# Patient Record
Sex: Female | Born: 1970 | Race: White | Hispanic: Yes | Marital: Married | Smoking: Never smoker
Health system: Southern US, Community
[De-identification: ages and names within clinical notes are randomized; demographics above are authoritative.]

## PROBLEM LIST (undated history)

## (undated) ENCOUNTER — Ambulatory Visit (HOSPITAL_COMMUNITY): Payer: Self-pay | Source: Home / Self Care

---

## 2001-10-27 ENCOUNTER — Encounter: Admission: RE | Admit: 2001-10-27 | Discharge: 2001-10-27 | Payer: Self-pay | Admitting: Family Medicine

## 2001-11-19 ENCOUNTER — Encounter: Admission: RE | Admit: 2001-11-19 | Discharge: 2001-11-19 | Payer: Self-pay | Admitting: Family Medicine

## 2001-11-27 ENCOUNTER — Encounter: Admission: RE | Admit: 2001-11-27 | Discharge: 2001-11-27 | Payer: Self-pay | Admitting: Family Medicine

## 2001-12-17 ENCOUNTER — Encounter: Admission: RE | Admit: 2001-12-17 | Discharge: 2001-12-17 | Payer: Self-pay | Admitting: Family Medicine

## 2001-12-21 ENCOUNTER — Encounter: Admission: RE | Admit: 2001-12-21 | Discharge: 2001-12-21 | Payer: Self-pay | Admitting: Family Medicine

## 2002-01-19 ENCOUNTER — Encounter: Admission: RE | Admit: 2002-01-19 | Discharge: 2002-01-19 | Payer: Self-pay | Admitting: Family Medicine

## 2002-01-27 ENCOUNTER — Ambulatory Visit (HOSPITAL_COMMUNITY): Admission: RE | Admit: 2002-01-27 | Discharge: 2002-01-27 | Payer: Self-pay | Admitting: Family Medicine

## 2002-02-02 ENCOUNTER — Encounter: Admission: RE | Admit: 2002-02-02 | Discharge: 2002-02-02 | Payer: Self-pay | Admitting: Sports Medicine

## 2002-02-18 ENCOUNTER — Encounter: Admission: RE | Admit: 2002-02-18 | Discharge: 2002-02-18 | Payer: Self-pay | Admitting: Family Medicine

## 2002-03-04 ENCOUNTER — Encounter: Admission: RE | Admit: 2002-03-04 | Discharge: 2002-03-04 | Payer: Self-pay | Admitting: Family Medicine

## 2002-03-12 ENCOUNTER — Encounter: Admission: RE | Admit: 2002-03-12 | Discharge: 2002-03-12 | Payer: Self-pay | Admitting: Family Medicine

## 2002-03-16 ENCOUNTER — Encounter: Admission: RE | Admit: 2002-03-16 | Discharge: 2002-03-16 | Payer: Self-pay | Admitting: Family Medicine

## 2002-03-24 ENCOUNTER — Encounter: Admission: RE | Admit: 2002-03-24 | Discharge: 2002-03-24 | Payer: Self-pay | Admitting: Family Medicine

## 2002-03-29 ENCOUNTER — Encounter: Admission: RE | Admit: 2002-03-29 | Discharge: 2002-03-29 | Payer: Self-pay | Admitting: Family Medicine

## 2002-04-07 ENCOUNTER — Encounter: Admission: RE | Admit: 2002-04-07 | Discharge: 2002-04-07 | Payer: Self-pay | Admitting: Sports Medicine

## 2002-04-08 ENCOUNTER — Encounter (HOSPITAL_COMMUNITY): Admission: AD | Admit: 2002-04-08 | Discharge: 2002-04-08 | Payer: Self-pay | Admitting: *Deleted

## 2002-04-10 ENCOUNTER — Inpatient Hospital Stay (HOSPITAL_COMMUNITY): Admission: AD | Admit: 2002-04-10 | Discharge: 2002-04-14 | Payer: Self-pay | Admitting: *Deleted

## 2002-04-10 ENCOUNTER — Encounter (INDEPENDENT_AMBULATORY_CARE_PROVIDER_SITE_OTHER): Payer: Self-pay | Admitting: *Deleted

## 2002-05-24 ENCOUNTER — Encounter: Admission: RE | Admit: 2002-05-24 | Discharge: 2002-05-24 | Payer: Self-pay | Admitting: Family Medicine

## 2002-08-09 ENCOUNTER — Encounter: Admission: RE | Admit: 2002-08-09 | Discharge: 2002-08-09 | Payer: Self-pay | Admitting: Family Medicine

## 2002-08-17 ENCOUNTER — Encounter: Admission: RE | Admit: 2002-08-17 | Discharge: 2002-08-17 | Payer: Self-pay | Admitting: Sports Medicine

## 2002-10-20 ENCOUNTER — Encounter: Admission: RE | Admit: 2002-10-20 | Discharge: 2002-10-20 | Payer: Self-pay | Admitting: Family Medicine

## 2002-10-27 ENCOUNTER — Encounter: Admission: RE | Admit: 2002-10-27 | Discharge: 2002-10-27 | Payer: Self-pay | Admitting: Family Medicine

## 2002-11-23 ENCOUNTER — Encounter: Admission: RE | Admit: 2002-11-23 | Discharge: 2002-11-23 | Payer: Self-pay | Admitting: Family Medicine

## 2003-01-12 ENCOUNTER — Encounter: Admission: RE | Admit: 2003-01-12 | Discharge: 2003-01-12 | Payer: Self-pay | Admitting: Family Medicine

## 2003-01-19 ENCOUNTER — Encounter: Admission: RE | Admit: 2003-01-19 | Discharge: 2003-01-19 | Payer: Self-pay | Admitting: Family Medicine

## 2003-12-30 ENCOUNTER — Encounter (INDEPENDENT_AMBULATORY_CARE_PROVIDER_SITE_OTHER): Payer: Self-pay | Admitting: *Deleted

## 2003-12-30 LAB — CONVERTED CEMR LAB

## 2004-01-18 ENCOUNTER — Encounter: Admission: RE | Admit: 2004-01-18 | Discharge: 2004-01-18 | Payer: Self-pay | Admitting: Family Medicine

## 2004-03-12 ENCOUNTER — Encounter: Admission: RE | Admit: 2004-03-12 | Discharge: 2004-03-12 | Payer: Self-pay | Admitting: Family Medicine

## 2005-05-07 ENCOUNTER — Ambulatory Visit: Payer: Self-pay | Admitting: Family Medicine

## 2005-08-20 ENCOUNTER — Ambulatory Visit (HOSPITAL_COMMUNITY): Admission: RE | Admit: 2005-08-20 | Discharge: 2005-08-20 | Payer: Self-pay | Admitting: Obstetrics

## 2005-12-05 ENCOUNTER — Inpatient Hospital Stay (HOSPITAL_COMMUNITY): Admission: AD | Admit: 2005-12-05 | Discharge: 2005-12-08 | Payer: Self-pay | Admitting: Obstetrics

## 2006-11-28 ENCOUNTER — Encounter (INDEPENDENT_AMBULATORY_CARE_PROVIDER_SITE_OTHER): Payer: Self-pay | Admitting: *Deleted

## 2010-10-17 ENCOUNTER — Ambulatory Visit
Admission: RE | Admit: 2010-10-17 | Discharge: 2010-10-17 | Payer: Self-pay | Source: Home / Self Care | Attending: Obstetrics & Gynecology | Admitting: Obstetrics & Gynecology

## 2011-02-15 NOTE — Op Note (Signed)
[facility]  Patient:    Beth Bernard, Beth Bernard Visit Number: [id number] MRN: [id number]          Service Type: ANT Location: [facility] Attending Physician:  Enid Cutter Dictated by:   Elinor Dodge, M.D. Admit Date:  04/08/2002 Discharge Date: 04/08/2002                             Operative Report  PREOPERATIVE DIAGNOSIS:       Persistent posterior presentation.  POSTOPERATIVE DIAGNOSIS:      Persistent posterior presentation.  PROCEDURE:                    Assisted vacuum partial extraction followed by forceps delivery.  SURGEON:                      Elinor Dodge, M.D.  FINDINGS:                     After pushing 2-1/2 hours, this very fatigued patient who had long labor was found to have a persistent posterior presentation at ROP.  The vacuum was placed on the occiput as far back as possible in order to try and flex the head for possible rotation.  This was not possible but, with three pushes, the patient brought the head down to the introitus from a +2 station.  At this point, the vacuum was removed and the Luikart-McClean forceps were placed on the vertex presenting.  A midline episiotomy was made and the baby delivered from an ROP presentation. Restitution went to LOP.  The baby was sucked.  A nuchal cord around the neck once.  The rest of the baby was easily delivered.  The cord was doubly clamped.  The baby was handed to the pediatricians.  The area was observed for lacerations.  There was a third-degree extension of the episiotomy, and the apex extended up slightly to the right sulcus area.  The uterus was then explored and the placenta removed manually.  The uterus was re-explored and found to be clean.  There were no lacerations of the cervix.  A 2-0 chromic suture was used, starting at the apex of the episiotomy inside the vagina and run with a running lock suture down to the introitus, approximating the hymenal ring and the mucocutaneous junction  in place.  Second sutures were used to reinforce the muscles on the perineum with interrupted, followed by continuous running lock sutures.  Interrupted sutures were used to reapproximate the anal sphincter.  A second layer of tissue was placed over the aforementioned with running lock suture.  The skin edges were approximated with the same suture from skin edge to skin edge up to the fourchette of the vagina.  These were all cut short.  There was minimal bleeding during the procedure.  The vagina was explored and sponges that were in there were removed.  The patient had a total blood loss of 500 cc during the procedure, tolerated it well with an epidural anesthesia. Dictated by:   Elinor Dodge, M.D. Attending Physician:  Enid Cutter DD:  04/12/02 TD:  04/13/02 Job: [id number]

## 2013-07-20 ENCOUNTER — Other Ambulatory Visit: Payer: Self-pay | Admitting: Internal Medicine

## 2013-07-20 DIAGNOSIS — R519 Headache, unspecified: Secondary | ICD-10-CM

## 2013-07-21 ENCOUNTER — Ambulatory Visit
Admission: RE | Admit: 2013-07-21 | Discharge: 2013-07-21 | Disposition: A | Discharge status: Home / Self Care | Payer: No Typology Code available for payment source | Source: Ambulatory Visit | Attending: Internal Medicine | Admitting: Internal Medicine

## 2013-07-21 MED ORDER — IOHEXOL 300 MG/ML  SOLN
75.0000 mL | Freq: Once | INTRAMUSCULAR | Status: AC | PRN
  Administered 2013-07-21: 75 mL via INTRAVENOUS

## 2014-07-23 IMAGING — CT CT HEAD WO/W CM
1 of 2 series · 13 of 30 positions shown, 17 images · IV contrast (omnipaque)
Comparison: None.

CLINICAL DATA: 42-year-old female with headache, pressure,
numbness, vertigo. Initial encounter.

EXAM:
CT HEAD WITHOUT AND WITH CONTRAST
TECHNIQUE: Contiguous axial images were obtained from the base of the skull
through the vertex without and with intravenous contrast
CONTRAST:  75mL OMNIPAQUE IOHEXOL 300 MG/ML  SOLN

[Series 32: 3d filtered head w/o · axial · non-contrast · 0.49mm/px · z∈[+22,+143]mm · 13 of 28 slices shown, 17 images]
[im 2/28  brain]
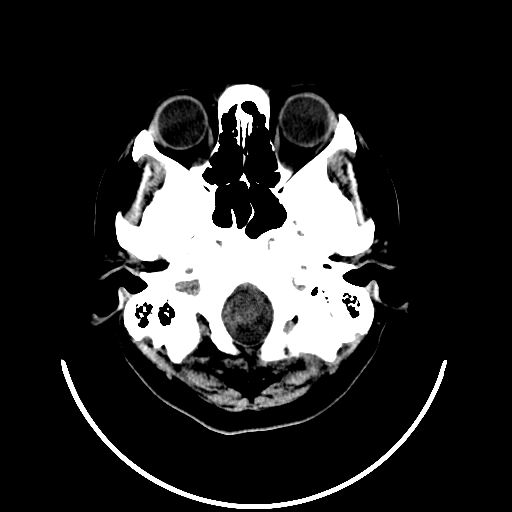
[im 2/28  bone]
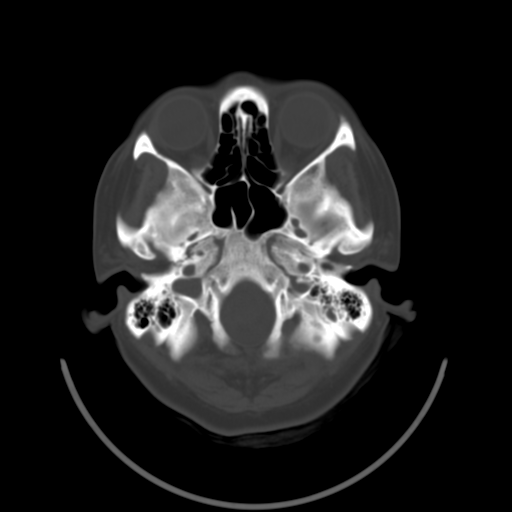
[im 4/28  brain]
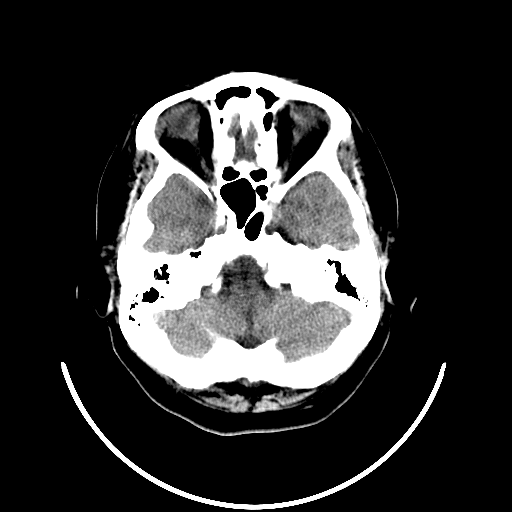
[im 6/28  brain]
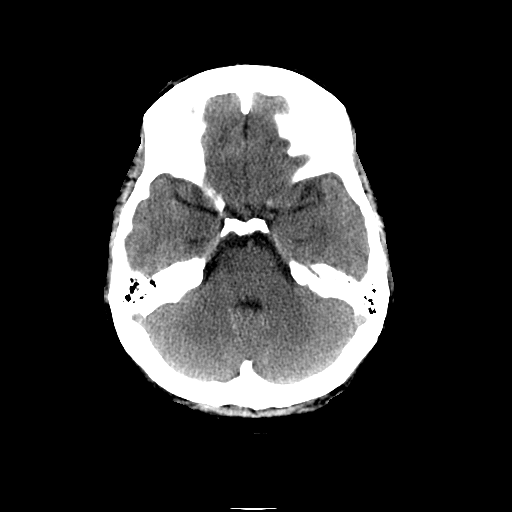
[im 8/28  brain]
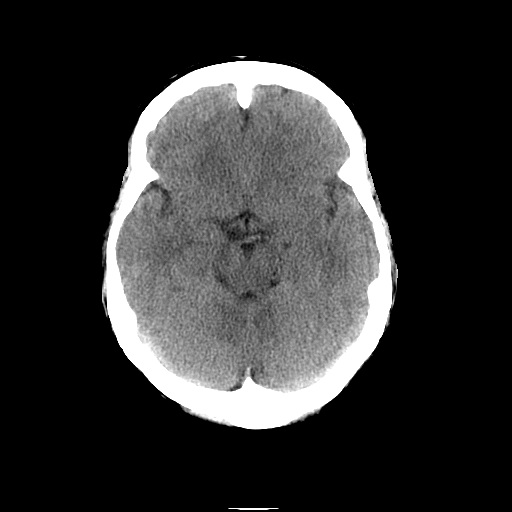
[im 10/28  brain]
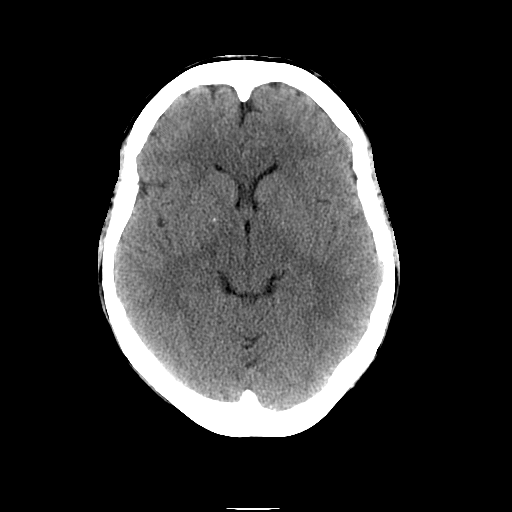
[im 10/28  bone]
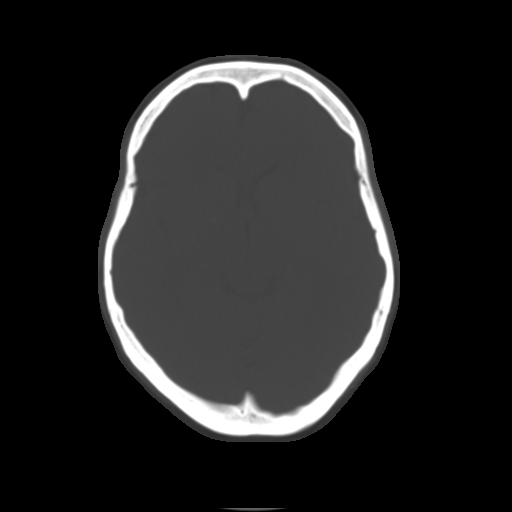
[im 12/28  brain]
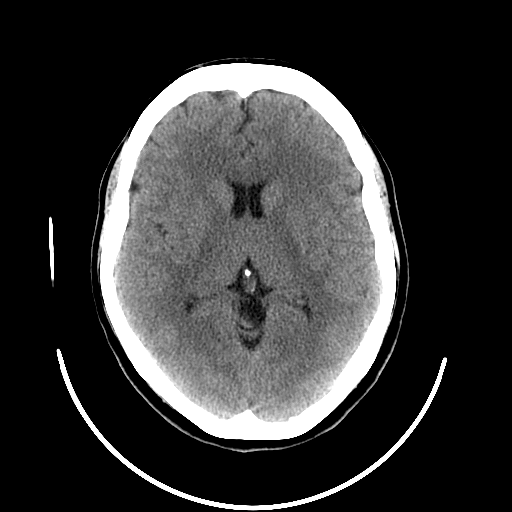
[im 14/28  brain]
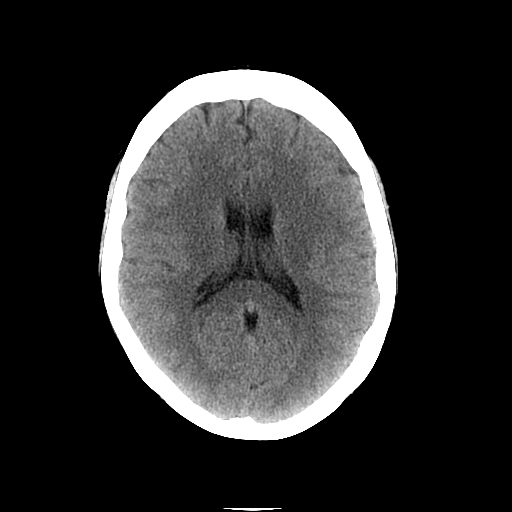
[im 16/28  brain]
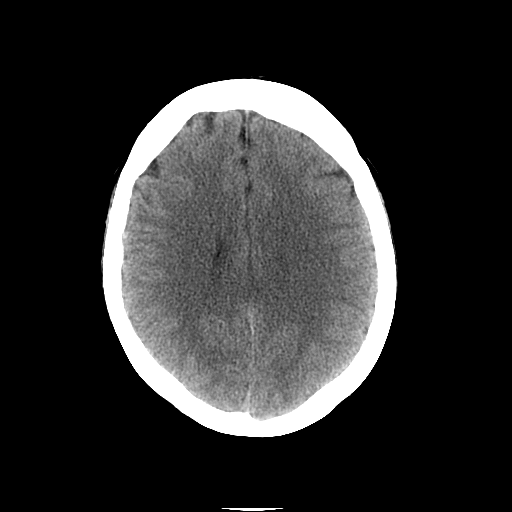
[im 18/28  brain]
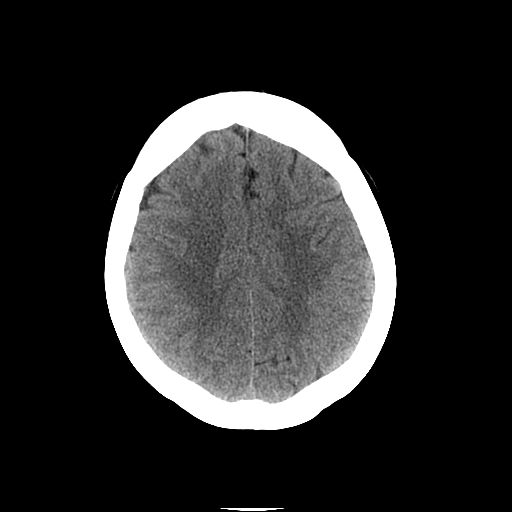
[im 18/28  bone]
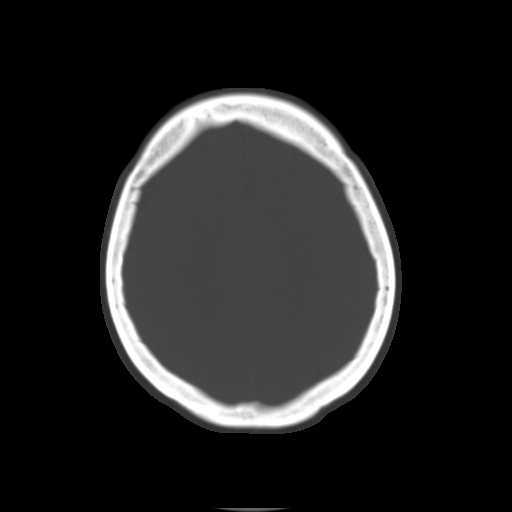
[im 20/28  brain]
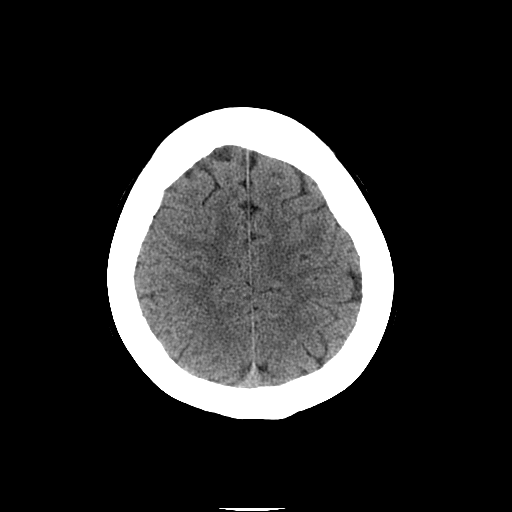
[im 22/28  brain]
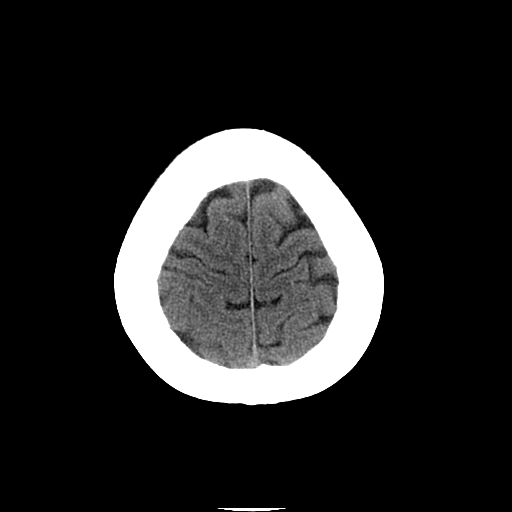
[im 24/28  brain]
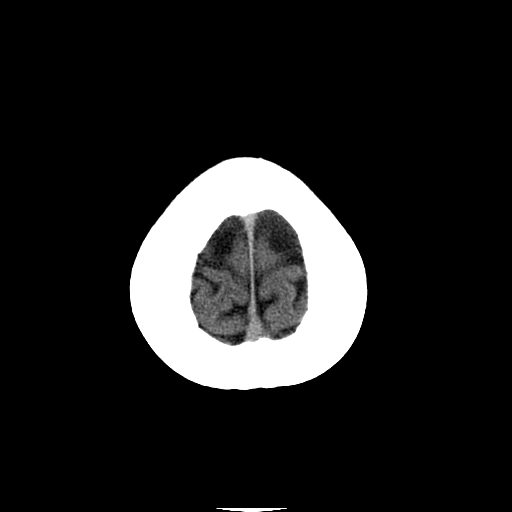
[im 26/28  brain]
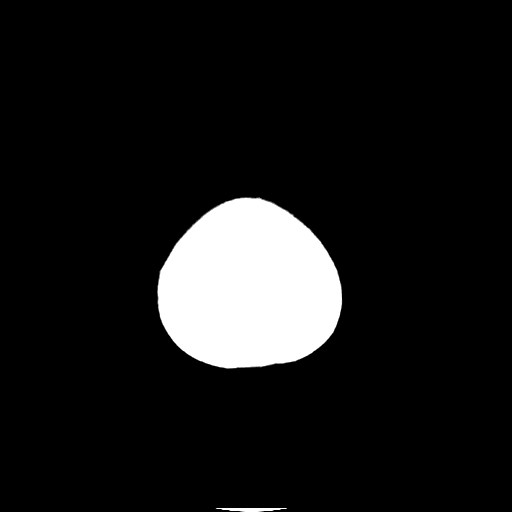
[im 26/28  bone]
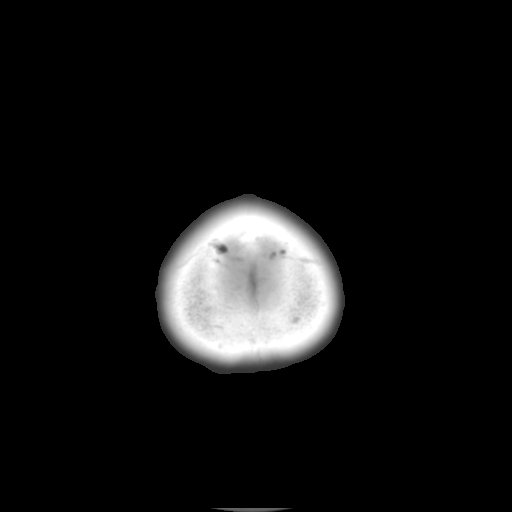

[13 of 30 positions shown; findings below may reference images not displayed]

FINDINGS: Visualized paranasal sinuses and mastoids are clear. Bone
mineralization is within normal limits. No osseous abnormality
identified. Visualized orbits and scalp soft tissues are within
normal limits.

Cerebral volume is normal. No midline shift, ventriculomegaly, mass
effect, evidence of mass lesion, intracranial hemorrhage or evidence
of cortically based acute infarction. Gray-white matter
differentiation is within normal limits throughout the brain. No
abnormal enhancement identified.
IMPRESSION: Normal CT appearance of the brain.

## 2014-10-17 ENCOUNTER — Ambulatory Visit: Payer: Self-pay | Attending: Internal Medicine

## 2014-10-25 ENCOUNTER — Ambulatory Visit: Payer: Self-pay | Admitting: Family Medicine

## 2014-10-25 ENCOUNTER — Encounter: Payer: Self-pay | Admitting: Internal Medicine

## 2014-10-25 ENCOUNTER — Ambulatory Visit: Payer: Self-pay | Attending: Internal Medicine | Admitting: Internal Medicine

## 2014-10-25 VITALS — BP 143/91 | HR 89 | Temp 98.0°F | Resp 16 | Wt 140.0 lb

## 2014-10-25 DIAGNOSIS — Z0000 Encounter for general adult medical examination without abnormal findings: Secondary | ICD-10-CM

## 2014-10-25 DIAGNOSIS — Z139 Encounter for screening, unspecified: Secondary | ICD-10-CM

## 2014-10-25 DIAGNOSIS — Z1231 Encounter for screening mammogram for malignant neoplasm of breast: Secondary | ICD-10-CM

## 2014-10-25 DIAGNOSIS — R519 Headache, unspecified: Secondary | ICD-10-CM | POA: Insufficient documentation

## 2014-10-25 DIAGNOSIS — Z23 Encounter for immunization: Secondary | ICD-10-CM | POA: Insufficient documentation

## 2014-10-25 DIAGNOSIS — R030 Elevated blood-pressure reading, without diagnosis of hypertension: Secondary | ICD-10-CM | POA: Insufficient documentation

## 2014-10-25 DIAGNOSIS — IMO0001 Reserved for inherently not codable concepts without codable children: Secondary | ICD-10-CM

## 2014-10-25 DIAGNOSIS — R51 Headache: Secondary | ICD-10-CM | POA: Insufficient documentation

## 2014-10-25 LAB — CBC WITH DIFFERENTIAL/PLATELET
BASOS ABS: 0 10*3/uL (ref 0.0–0.1)
Basophils Relative: 0 % (ref 0–1)
EOS ABS: 0 10*3/uL (ref 0.0–0.7)
EOS PCT: 1 % (ref 0–5)
HEMATOCRIT: 39.6 % (ref 36.0–46.0)
HEMOGLOBIN: 13.3 g/dL (ref 12.0–15.0)
LYMPHS ABS: 1.8 10*3/uL (ref 0.7–4.0)
Lymphocytes Relative: 41 % (ref 12–46)
MCH: 29.8 pg (ref 26.0–34.0)
MCHC: 33.6 g/dL (ref 30.0–36.0)
MCV: 88.8 fL (ref 78.0–100.0)
MONO ABS: 0.3 10*3/uL (ref 0.1–1.0)
MPV: 9.4 fL (ref 8.6–12.4)
Monocytes Relative: 8 % (ref 3–12)
NEUTROS PCT: 50 % (ref 43–77)
Neutro Abs: 2.2 10*3/uL (ref 1.7–7.7)
PLATELETS: 229 10*3/uL (ref 150–400)
RBC: 4.46 MIL/uL (ref 3.87–5.11)
RDW: 14.4 % (ref 11.5–15.5)
WBC: 4.3 10*3/uL (ref 4.0–10.5)

## 2014-10-25 MED ORDER — AMITRIPTYLINE HCL 75 MG PO TABS: 75.0000 mg | ORAL_TABLET | Freq: Every day | ORAL | Status: DC

## 2014-10-25 NOTE — Progress Notes (Signed)
Patient Demographics  Beth Bernard, is a 44 y.o. female  NWG:[id number]  VHQ:[id number]  DOB - 10-Feb-1971  CC:  Chief Complaint  Patient presents with  . new patient       HPI: Beth Bernard is a 44 y.o. female here today to establish medical care.patient reported to have chronic headache which starts from front to the temple and goes to the back and neck area, she denies any numbness weakness, denies any nausea vomiting, she had a CAT scan done in October 2014 for the similar symptoms and the CAT scan reported to be normal, she has tried over-the-counter Tylenol without much improvement as per patient the headache is more worse at night. Patient has No headache, No chest pain, No abdominal pain - No Nausea, No new weakness tingling or numbness, No Cough - SOB.  No Known Allergies History reviewed. No pertinent past medical history. No current outpatient prescriptions on file prior to visit.   No current facility-administered medications on file prior to visit.   Family History  Problem Relation Age of Onset  . Cancer Paternal Uncle     stomach cancer    History   Social History  . Marital Status: Married    Spouse Name: N/A    Number of Children: N/A  . Years of Education: N/A   Occupational History  . Not on file.   Social History Main Topics  . Smoking status: Never Smoker   . Smokeless tobacco: Not on file  . Alcohol Use: No  . Drug Use: Not on file  . Sexual Activity: Not on file   Other Topics Concern  . Not on file   Social History Narrative  . No narrative on file    Review of Systems: Constitutional: Negative for fever, chills, diaphoresis, activity change, appetite change and fatigue. HENT: Negative for ear pain, nosebleeds, congestion, facial swelling, rhinorrhea, neck pain, neck stiffness and ear discharge.  Eyes: Negative for pain, discharge, redness, itching and visual disturbance. Respiratory: Negative for cough, choking,  chest tightness, shortness of breath, wheezing and stridor.  Cardiovascular: Negative for chest pain, palpitations and leg swelling. Gastrointestinal: Negative for abdominal distention. Genitourinary: Negative for dysuria, urgency, frequency, hematuria, flank pain, decreased urine volume, difficulty urinating and dyspareunia.  Musculoskeletal: Negative for back pain, joint swelling, arthralgia and gait problem. Neurological: Negative for dizziness, tremors, seizures, syncope, facial asymmetry, speech difficulty, weakness, light-headedness, numbness and headaches.  Hematological: Negative for adenopathy. Does not bruise/bleed easily. Psychiatric/Behavioral: Negative for hallucinations, behavioral problems, confusion, dysphoric mood, decreased concentration and agitation.    Objective:   Filed Vitals:   10/25/14 1610  BP: 143/91  Pulse: 89  Temp: 98 F (36.7 C)  Resp: 16    Physical Exam: Constitutional: Patient appears well-developed and well-nourished. No distress. HENT: Normocephalic, atraumatic, External right and left ear normal. Oropharynx is clear and moist.  Eyes: Conjunctivae and EOM are normal. PERRLA, no scleral icterus. Neck: Normal ROM. Neck supple. No JVD. No tracheal deviation. No thyromegaly. CVS: RRR, S1/S2 +, no murmurs, no gallops, no carotid bruit.  Pulmonary: Effort and breath sounds normal, no stridor, rhonchi, wheezes, rales.  Abdominal: Soft. BS +, no distension, tenderness, rebound or guarding.  Musculoskeletal: Normal range of motion. No edema and no tenderness.  Neuro: Alert. Normal reflexes, muscle tone coordination. No cranial nerve deficit. Skin: Skin is warm and dry. No rash noted. Not diaphoretic. No erythema. No pallor. Psychiatric: Normal mood and affect. Behavior, judgment, thought content normal.  No  results found for: WBC, HGB, HCT, MCV, PLT No results found for: CREATININE, BUN, NA, K, CL, CO2  No results found for: HGBA1C Lipid Panel  No  results found for: CHOL, TRIG, HDL, CHOLHDL, VLDL, LDLCALC     Assessment and plan:   1. Chronic nonintractable headache, unspecified headache type Patient had CT scan done 10/14 for similar symptoms which was negative Her neuro exam is non focal likely tension headache trial of elavil. - amitriptyline (ELAVIL) 75 MG tablet; Take 1 tablet (75 mg total) by mouth at bedtime.  Dispense: 30 tablet; Refill: 3  4. Elevated BP Advised patient for DASH diet   5. Encounter for screening mammogram for breast cancer  - MM DIGITAL SCREENING BILATERAL; Future  6. Screening Ordered  baseline blood work  - CBC with Differential/Platelet - COMPLETE METABOLIC PANEL WITH GFR - TSH - Lipid panel - Vit D  25 hydroxy (rtn osteoporosis monitoring) - Hemoglobin A1c  Health Maintenance  -Pap Smear: will be scheduled in our office  -Mammogram: ordered  -Vaccinations:  Flu shot and Tdap  given today   Return in about 3 months (around 01/24/2015) for Headache,  Schedule appt with Dr Glendora ScoreFunches/ Valerie for PAP .   Doris CheadleADVANI, Kanya Potteiger, MD

## 2014-10-25 NOTE — Patient Instructions (Signed)
Plan de alimentacin DASH (DASH Eating Plan) DASH es la sigla en ingls de "Enfoques Alimentarios para Detener la Hipertensin". El plan de alimentacin DASH ha demostrado bajar la presin arterial elevada (hipertensin). Los beneficios adicionales para la salud pueden incluir la disminucin del riesgo de diabetes mellitus tipo2, enfermedades cardacas e ictus. Este plan tambin puede ayudar a adelgazar. QU DEBO SABER ACERCA DEL PLAN DE ALIMENTACIN DASH? Para el plan de alimentacin DASH, seguir las siguientes pautas generales:  Elija los alimentos con un valor porcentual diario de sodio de menos del 5% (segn figura en la etiqueta del alimento).  Use hierbas o aderezos sin sal, en lugar de sal de mesa o sal marina.  Consulte al mdico o farmacutico antes de usar sustitutos de la sal.  Coma productos con bajo contenido de sodio, cuya etiqueta suele decir "bajo contenido de sodio" o "sin agregado de sal".  Coma alimentos frescos.  Coma ms verduras, frutas y productos lcteos con bajo contenido de grasas.  Elija los cereales integrales. Busque la palabra "integral" en el primer lugar de la lista de ingredientes.  Elija el pescado y el pollo o el pavo sin piel ms a menudo que las carnes rojas. Limite el consumo de pescado, carne de ave y carne a 6onzas (170g) por da.  Limite el consumo de dulces, postres, azcares y bebidas azucaradas.  Elija las grasas saludables para el corazn.  Limite el consumo de queso a 1onza (28g) por da.  Consuma ms comida casera y menos de restaurante, de buf y comida rpida.  Limite el consumo de alimentos fritos.  Cocine los alimentos utilizando mtodos que no sean la fritura.  Limite las verduras enlatadas. Si las consume, enjuguelas bien para disminuir el sodio.  Cuando coma en un restaurante, pida que preparen su comida con menos sal o, en lo posible, sin nada de sal. QU ALIMENTOS PUEDO COMER? Pida ayuda a un nutricionista para  conocer las necesidades calricas individuales. Cereales Pan de salvado o integral. Arroz integral. Pastas de salvado o integrales. Quinua, trigo burgol y cereales integrales. Cereales con bajo contenido de sodio. Tortillas de harina de maz o de salvado. Pan de maz integral. Galletas saladas integrales. Galletas con bajo contenido de sodio. Vegetales Verduras frescas o congeladas (crudas, al vapor, asadas o grilladas). Jugos de tomate y verduras con contenido bajo o reducido de sodio. Pasta y salsa de tomate con contenido bajo o reducido de sodio. Verduras enlatadas con bajo contenido de sodio o reducido de sodio.  Frutas Frutas frescas, en conserva (en su jugo natural) o frutas congeladas. Carnes y otros productos con protenas Carne de res molida (al 85% o ms magra), carne de res de animales alimentados con pastos o carne de res sin la grasa. Pollo o pavo sin piel. Carne de pollo o de pavo molida. Cerdo sin la grasa. Todos los pescados y frutos de mar. Huevos. Porotos, guisantes o lentejas secos. Frutos secos y semillas sin sal. Frijoles enlatados sin sal. Lcteos Productos lcteos con bajo contenido de grasas, como leche descremada o al 1%, quesos reducidos en grasas o al 2%, ricota con bajo contenido de grasas o queso cottage, o yogur natural con bajo contenido de grasas. Quesos con contenido bajo o reducido de sodio. Grasas y aceites Margarinas en barra que no contengan grasas trans. Mayonesa y alios para ensaladas livianos o reducidos en grasas (reducidos en sodio). [place]ac[place]. Aceites de crtamo, oliva o canola. Mantequilla natural de man o almendra. Otros Palomitas de maz y pretzels sin sal.   Los artculos mencionados arriba pueden no ser una lista completa de las bebidas o los alimentos recomendados. Comunquese con el nutricionista para conocer ms opciones. QU ALIMENTOS NO SE RECOMIENDAN? Cereales Pan blanco. Pastas blancas. Arroz blanco. Pan de maz refinado. Bagels y  croissants. Galletas saladas que contengan grasas trans. Vegetales Vegetales con crema o fritos. Verduras en salsa de queso. Verduras enlatadas comunes. Pasta y salsa de tomate en lata comunes. Jugos comunes de tomate y de verduras. Frutas Frutas secas. Fruta enlatada en almbar liviano o espeso. Jugo de frutas. Carnes y otros productos con protenas Cortes de carne con grasa. Costillas, alas de pollo, tocineta, salchicha, mortadela, salame, chinchulines, tocino, perros calientes, salchichas alemanas y embutidos envasados. Frutos secos y semillas con sal. Frijoles con sal en lata. Lcteos Leche entera o al 2%, crema, mezcla de leche y crema, y queso crema. Yogur entero o endulzado. Quesos o queso azul con alto contenido de grasas. Cremas no lcteas y coberturas batidas. Quesos procesados, quesos para untar o cuajadas. Condimentos Sal de cebolla y ajo, sal condimentada, sal de mesa y sal marina. Salsas en lata y envasadas. Salsa Worcestershire. Salsa trtara. Salsa barbacoa. Salsa teriyaki. Salsa de soja, incluso la que tiene contenido reducido de sodio. Salsa de carne. Salsa de pescado. Salsa de ostras. Salsa rosada. Rbano picante. Ketchup y mostaza. Saborizantes y tiernizantes para carne. Caldo en cubitos. Salsa picante. Salsa tabasco. Adobos. Aderezos para tacos. Salsas. Grasas y aceites Mantequilla, margarina en barra, manteca de cerdo, grasa, mantequilla clarificada y grasa de tocino. Aceites de coco, de palmiste o de palma. Aderezos comunes para ensalada. Otros Pickles y aceitunas. Palomitas de maz y pretzels con sal. Los artculos mencionados arriba pueden no ser una lista completa de las bebidas y los alimentos que se deben evitar. Comunquese con el nutricionista para obtener ms informacin. DNDE PUEDO ENCONTRAR MS INFORMACIN? [employer] del Corazn, del Pulmn y de la Sangre (National Heart, Lung, and Blood Institute):  www.nhlbi.nih.gov/health/health-topics/topics/dash/ Document Released: 09/05/2011 Document Revised: 01/31/2014 ExitCare Patient Information 2015 [employer]. This information is not intended to replace advice given to you by your health care provider. Make sure you discuss any questions you have with your health care provider.   

## 2014-10-25 NOTE — Progress Notes (Signed)
Patient here with interpreter Patient here to establish care Complains of having headaches on and off for the past year Pain sometimes shoots to the back of her head and down her back Patient also complains of having some pain to the back of her eye

## 2014-10-26 LAB — COMPLETE METABOLIC PANEL WITH GFR
ALBUMIN: 4.2 g/dL (ref 3.5–5.2)
ALT: 13 U/L (ref 0–35)
AST: 19 U/L (ref 0–37)
Alkaline Phosphatase: 97 U/L (ref 39–117)
BUN: 15 mg/dL (ref 6–23)
CALCIUM: 9.3 mg/dL (ref 8.4–10.5)
CHLORIDE: 106 meq/L (ref 96–112)
CO2: 29 meq/L (ref 19–32)
Creat: 0.68 mg/dL (ref 0.50–1.10)
GFR, Est African American: >89 mL/min
GFR, Est Non African American: >89 mL/min
GLUCOSE: 104 mg/dL — AB (ref 70–99)
POTASSIUM: 5 meq/L (ref 3.5–5.3)
Sodium: 141 mEq/L (ref 135–145)
Total Bilirubin: 0.7 mg/dL (ref 0.2–1.2)
Total Protein: 6.9 g/dL (ref 6.0–8.3)

## 2014-10-26 LAB — LIPID PANEL
CHOL/HDL RATIO: 3.4 ratio
CHOLESTEROL: 161 mg/dL (ref 0–200)
HDL: 48 mg/dL (ref >39)
LDL CALC: 64 mg/dL (ref 0–99)
Triglycerides: 246 mg/dL — ABNORMAL HIGH (ref <150)
VLDL: 49 mg/dL — ABNORMAL HIGH (ref 0–40)

## 2014-10-26 LAB — HEMOGLOBIN A1C
Hgb A1c MFr Bld: 5.2 % (ref <5.7)
Mean Plasma Glucose: 103 mg/dL (ref <117)

## 2014-10-26 LAB — TSH: TSH: 2.278 u[IU]/mL (ref 0.350–4.500)

## 2014-10-26 LAB — VITAMIN D 25 HYDROXY (VIT D DEFICIENCY, FRACTURES): VIT D 25 HYDROXY: 8 ng/mL — AB (ref 30–100)

## 2014-10-27 ENCOUNTER — Telehealth: Payer: Self-pay | Admitting: *Deleted

## 2014-10-27 MED ORDER — VITAMIN D (ERGOCALCIFEROL) 1.25 MG (50000 UNIT) PO CAPS: 50000.0000 [IU] | ORAL_CAPSULE | ORAL | Status: DC

## 2014-10-27 NOTE — Telephone Encounter (Signed)
Left voice message to return call (VM was left in Spanish) [facility]

## 2014-10-27 NOTE — Telephone Encounter (Signed)
-----   Message from Doris Cheadleeepak Advani, MD sent at 10/26/2014  9:16 AM EST ----- Blood work reviewed, noticed low vitamin D, call patient advise to start ergocalciferol 50,000 units once a week for the duration of  12 weeks.  noticed elevated triglycerides, advise patient for low fat diet.

## 2014-11-04 ENCOUNTER — Encounter: Payer: Self-pay | Admitting: Family Medicine

## 2014-11-04 ENCOUNTER — Ambulatory Visit: Payer: Self-pay | Attending: Family Medicine | Admitting: Family Medicine

## 2014-11-04 VITALS — BP 119/77 | HR 77 | Temp 98.1°F | Resp 16 | Ht 64.0 in | Wt 139.0 lb

## 2014-11-04 DIAGNOSIS — N898 Other specified noninflammatory disorders of vagina: Secondary | ICD-10-CM

## 2014-11-04 DIAGNOSIS — N814 Uterovaginal prolapse, unspecified: Secondary | ICD-10-CM

## 2014-11-04 DIAGNOSIS — Z124 Encounter for screening for malignant neoplasm of cervix: Secondary | ICD-10-CM

## 2014-11-04 NOTE — Assessment & Plan Note (Signed)
Pap, wet prep GC/chlam done today

## 2014-11-04 NOTE — Progress Notes (Signed)
[employer] interpreter used Patient here for pap smear smear Patient has had a flu shot Patient reports " 3 years ago her uterus detached so now it is outside the body"

## 2014-11-04 NOTE — Progress Notes (Signed)
   Subjective:    Patient ID: Beth Bernard, female    DOB: 30-Jul-1971, 44 y.o.   MRN: [id number] CC: pap smear  PCP: Dr. Orpah CobbAdvani  HPI 44 yo F presents for pap Spanish interpreter present  1. Pap smear: no hx of abnormal paps. Some vaginal d/c w/o itching or odor. Has hx of prolapsed uterus. No pelvic pain or discomfort.   Soc Hx: non smoker  Review of Systems As per HPI     Objective:   Physical Exam BP 119/77 mmHg  Pulse 77  Temp(Src) 98.1 F (36.7 C)  Resp 16  Ht 5\' 4"  (1.626 m)  Wt 139 lb (63.05 kg)  BMI 23.85 kg/m2  SpO2 100%  LMP 10/29/2014 General appearance: alert, cooperative and no distress Abdomen: soft, non-tender; bowel sounds normal; no masses,  no organomegaly Pelvic: cervix normal in appearance, external genitalia normal, no adnexal masses or tenderness, no cervical motion tenderness, positive findings: uterine prolapse or vaginal discharge:  white and mucoid, rectovaginal septum normal and uterus normal size, shape, and consistency       Assessment & Plan:

## 2014-11-04 NOTE — Assessment & Plan Note (Signed)
A: confirmed on exam P: Reassurance  Gyn referral prn symptoms or patient preference for discussion

## 2014-11-04 NOTE — Patient Instructions (Signed)
Mrs. Beth Bernard,  Thank you for coming in today. You will be called with pap results  Dr. Armen PickupFunches

## 2014-11-07 ENCOUNTER — Telehealth: Payer: Self-pay | Admitting: *Deleted

## 2014-11-07 DIAGNOSIS — N898 Other specified noninflammatory disorders of vagina: Secondary | ICD-10-CM | POA: Insufficient documentation

## 2014-11-07 LAB — CERVICOVAGINAL ANCILLARY ONLY
Chlamydia: NEGATIVE
Neisseria Gonorrhea: NEGATIVE
WET PREP (BD AFFIRM): POSITIVE — AB
Wet Prep (BD Affirm): NEGATIVE
Wet Prep (BD Affirm): NEGATIVE

## 2014-11-07 MED ORDER — FLUCONAZOLE 150 MG PO TABS: 150.0000 mg | ORAL_TABLET | ORAL | Status: DC

## 2014-11-07 NOTE — Addendum Note (Signed)
Addended by: Dessa PhiFUNCHES, Anjanette Gilkey on: 11/07/2014 09:12 AM   Modules accepted: Orders

## 2014-11-07 NOTE — Telephone Encounter (Signed)
-----   Message from Lora PaulaJosalyn C Funches, MD sent at 11/07/2014  5:13 PM EST ----- Neg GC/chlam

## 2014-11-07 NOTE — Assessment & Plan Note (Signed)
Yeast on wet prep, sent in diflucan

## 2014-11-07 NOTE — Telephone Encounter (Signed)
LVM to return call.

## 2014-11-07 NOTE — Telephone Encounter (Signed)
LVM to return call, [facility] (VM left in Spanish)

## 2014-11-07 NOTE — Telephone Encounter (Signed)
-----   Message from Lora PaulaJosalyn C Funches, MD sent at 11/07/2014  9:11 AM EST ----- Negative wet prep except for yeast. Sent in diflucan

## 2014-11-08 LAB — CYTOLOGY - PAP

## 2014-11-10 ENCOUNTER — Telehealth: Payer: Self-pay | Admitting: *Deleted

## 2014-11-10 NOTE — Telephone Encounter (Signed)
-----   Message from Lora PaulaJosalyn C Funches, MD sent at 11/10/2014  9:11 AM EST ----- Negative pap repeat in 5 years

## 2014-11-10 NOTE — Telephone Encounter (Signed)
Left voice message with normal results  If any question return call (message left in Spanish)

## 2014-11-16 ENCOUNTER — Ambulatory Visit (HOSPITAL_COMMUNITY)
Admission: RE | Admit: 2014-11-16 | Discharge: 2014-11-16 | Disposition: A | Discharge status: Home / Self Care | Payer: Self-pay | Source: Ambulatory Visit | Attending: Internal Medicine | Admitting: Internal Medicine

## 2014-11-16 DIAGNOSIS — Z1231 Encounter for screening mammogram for malignant neoplasm of breast: Secondary | ICD-10-CM | POA: Insufficient documentation

## 2014-12-14 ENCOUNTER — Emergency Department (INDEPENDENT_AMBULATORY_CARE_PROVIDER_SITE_OTHER)
Admission: EM | Admit: 2014-12-14 | Discharge: 2014-12-14 | Disposition: A | Discharge status: Home / Self Care | Payer: Self-pay | Source: Home / Self Care | Attending: Family Medicine | Admitting: Family Medicine

## 2014-12-14 ENCOUNTER — Encounter (HOSPITAL_COMMUNITY): Payer: Self-pay | Admitting: Emergency Medicine

## 2014-12-14 DIAGNOSIS — M791 Myalgia, unspecified site: Secondary | ICD-10-CM

## 2014-12-14 MED ORDER — NAPROXEN 375 MG PO TABS: 375.0000 mg | ORAL_TABLET | Freq: Two times a day (BID) | ORAL | Status: DC

## 2014-12-14 NOTE — ED Provider Notes (Signed)
Lynnae PrudeMaria Cabanas-Monroy is a 44 y.o. female who presents to Urgent Care today for body aches headache chills. Symptoms present for one day. Patient has tried Tylenol which helps some. No vomiting diarrhea. Patient does note subjective fevers and chills. No chest pains palpitations abdominal pain.   History reviewed. No pertinent past medical history. History reviewed. No pertinent past surgical history. History  Substance Use Topics  . Smoking status: Never Smoker   . Smokeless tobacco: Not on file  . Alcohol Use: No   ROS as above Medications: No current facility-administered medications for this encounter.   Current Outpatient Prescriptions  Medication Sig Dispense Refill  . amitriptyline (ELAVIL) 75 MG tablet Take 1 tablet (75 mg total) by mouth at bedtime. 30 tablet 3  . fluconazole (DIFLUCAN) 150 MG tablet Take 1 tablet (150 mg total) by mouth every 3 (three) days. 2 tablet 0  . naproxen (NAPROSYN) 375 MG tablet Take 1 tablet (375 mg total) by mouth 2 (two) times daily. 20 tablet 0  . Vitamin D, Ergocalciferol, (DRISDOL) 50000 UNITS CAPS capsule Take 1 capsule (50,000 Units total) by mouth every 7 (seven) days. 12 capsule 0   No Known Allergies   Exam:  BP 115/79 mmHg  Pulse 82  Temp(Src) 98.4 F (36.9 C) (Oral)  Resp 16  SpO2 99%  LMP 11/08/2014 Gen: Well NAD HEENT: EOMI,  MMM clear nasal discharge. Normal tympanic membranes Lungs: Normal work of breathing. CTABL Heart: RRR no MRG Abd: NABS, Soft. Nondistended, Nontender Exts: Brisk capillary refill, warm and well perfused.   No results found for this or any previous visit (from the past 24 hour(s)). No results found.  Assessment and Plan: 44 y.o. female with viral URI. Treat with naproxen. Return as needed.  Discussed warning signs or symptoms. Please see discharge instructions. Patient expresses understanding.     Rodolph BongEvan S Paxton Kanaan, MD 12/14/14 [phone]

## 2014-12-14 NOTE — ED Notes (Signed)
C/o body aches, headaches, nauseas, chills and abd pain Denies urinary sx, fevers, diarrhea Alert, no signs of acute distress.

## 2014-12-14 NOTE — Discharge Instructions (Signed)
Thank you for coming in today.  Gripe (Influenza) La gripe es una infeccin viral del tracto respiratorio. Ocurre con ms frecuencia en los meses de invierno, ya que las personas pasan ms tiempo en contacto cercano. La gripe puede enfermarlo considerablemente. Se transmite fcilmente de [place] persona a otra (es contagiosa). CAUSAS  La causa es un virus que infecta el tracto respiratorio. Puede contagiarse el virus al aspirar las gotitas que una persona infectada elimina al toser o Engineering geologistestornudar. Tambin puede contagiarse al tocar algo que fue recientemente contaminado con el virus y Tenet Healthcareluego llevarse la mano a la boca, la nariz o los ojos. RIESGOS Y COMPLICACIONES Tendr mayor riesgo de sufrir un resfro grave si consume cigarrillos, es diabtico, sufre una enfermedad cardaca (como insuficiencia cardaca) o pulmonar crnica (como asma) o si tiene debilitado el sistema inmunolgico. Los ancianos y las mujeres embarazadas tienen ms riesgo de sufrir infecciones graves. El problema ms frecuente de la gripe es la infeccin pulmonar (neumona). En algunos casos, este problema puede requerir atencin mdica de emergencia y Biochemist, clinicalponer en peligro la vida. Marnee SpringSIGNOS Y SNTOMAS  Los sntomas pueden durar entre 4 y [address] das y pueden ser:  Grant RutsFiebre.  Escalofros.  Dolor de Turkmenistancabeza, dolores en el cuerpo y musculares.  Dolor de Advertising copywritergarganta.  Molestias en el pecho y tos.  Prdida del apetito.  Debilidad o cansancio.  Mareos.  Nuseas o vmitos. DIAGNSTICO  El diagnstico se realiza segn su historia clnica y un examen fsico. Es necesario realizar un anlisis de cultivo farngeo o nasal para confirmar el diagnstico. TRATAMIENTO  En los casos leves, la gripe se cura sin TEFL teachertratamiento. El tratamiento est dirigido a Consulting civil engineeraliviar los sntomas. En los casos ms graves, el mdico podr recetar medicamentos antivirales para acortar el curso de la enfermedad. Los antibiticos no son eficaces, ya que la infeccin est causada por  un virus y no una bacteria. INSTRUCCIONES PARA EL CUIDADO EN EL HOGAR  Tome los medicamentos solamente como se lo haya indicado el mdico.  Utilice un humidificador de niebla fra para facilitar la respiracin.  Haga reposo hasta que la temperatura vuelva a ser normal. Generalmente esto lleva entre 3 y [address] das.  Beba suficiente lquido para Photographermantener la orina clara o de color amarillo plido.  Cbrase la boca y la nariz al toser o Engineering geologistestornudar, y Allstatelvese las manos muy bien para evitar que se propague el virus.  Lanny HurstQudese en su casa y no concurra al Aleen Campitrabajo o a la escuela hasta que la fiebre haya desaparecido al menos por un da completo. PREVENCIN  La vacunacin anual contra la gripe es la mejor manera de evitar enfermarse. Se recomienda ahora de manera rutinaria una vacuna anual contra la gripe a todos los Lowe's Companiesadultos estadounidenses. SOLICITE ATENCIN MDICA SI:  Tiene dolor en el pecho, la tos empeora o tiene ms mucosidad.  Tiene nuseas, vmitos o diarrea.  La fiebre regresa o empeora. SOLICITE ATENCIN MDICA DE INMEDIATO SI:   Tiene dificultad para respirar, le falta el aire o tiene la piel o las uas Hutchinsonazuladas.  Presenta dolor intenso o entumecimiento en el cuello.  Le duele la cabeza de forma repentina o tiene dolor en la cara o el odo.  Tiene nuseas o vmitos que no puede controlar. ASEGRESE DE QUE:   Comprende estas instrucciones.  Controlar su afeccin.  Recibir ayuda de inmediato si no mejora o si empeora. Document Released: 06/26/2005 Document Revised: 01/31/2014 [facility] [employer] Patient Information 2015 [employer], [place]. This information is not intended to replace advice  given to you by your health care provider. Make sure you discuss any questions you have with your health care provider.

## 2015-02-08 ENCOUNTER — Emergency Department (INDEPENDENT_AMBULATORY_CARE_PROVIDER_SITE_OTHER)
Admission: EM | Admit: 2015-02-08 | Discharge: 2015-02-08 | Disposition: A | Discharge status: Home / Self Care | Payer: Self-pay | Source: Home / Self Care | Attending: Family Medicine | Admitting: Family Medicine

## 2015-02-08 ENCOUNTER — Encounter (HOSPITAL_COMMUNITY): Payer: Self-pay | Admitting: Emergency Medicine

## 2015-02-08 DIAGNOSIS — R059 Cough, unspecified: Secondary | ICD-10-CM

## 2015-02-08 DIAGNOSIS — R05 Cough: Secondary | ICD-10-CM

## 2015-02-08 DIAGNOSIS — R0982 Postnasal drip: Secondary | ICD-10-CM

## 2015-02-08 MED ORDER — IPRATROPIUM BROMIDE 0.06 % NA SOLN: 2.0000 | Freq: Four times a day (QID) | NASAL | Status: DC

## 2015-02-08 MED ORDER — BENZONATATE 100 MG PO CAPS
100.0000 mg | ORAL_CAPSULE | Freq: Three times a day (TID) | ORAL | Status: DC | PRN
Start: 2015-02-08 — End: 2023-11-06

## 2015-02-08 MED ORDER — FLUTICASONE PROPIONATE 50 MCG/ACT NA SUSP: 2.0000 | Freq: Every day | NASAL | Status: DC

## 2015-02-08 NOTE — Discharge Instructions (Signed)
Your symptoms are likely from postnasal drip causing lung irritation and cough. Please start using a daily allergy medicine such as Zyrtec or Allegra. Please use the nasal Atrovent to try it nasal secretions and Flonase at night to help with the swollen nasal passages in the back. Please use the Tessalon Perles for cough during the day or night. Noted that this cough may last a couple more weeks before going away. There is no sign of pneumonia or other serious infection at this period in time. He is also start using ibuprofen 600 mg every 6 hours.   Sus sntomas son probablemente de goteo retronasal causar irritacin de los pulmones y la tos . Por favor, empiece a usar una medicina para la Programmer, multimediaalergia al da como Zyrtec o [place]legra . Utilice el Atrovent nasal para probarlo secreciones nasales y Flonase por la noche para ayudar con los conductos nasales inflamados en la parte posterior . Utilice el Tessalon Perles para la tos durante el da o la noche . Seal que esta tos puede durar un par de semanas ms antes de irse . No hay ninguna seal de neumona u otras infecciones graves en este perodo de [place] Citytiempo. Tambin se empiece a usar ibuprofeno 600 mg cada 6 horas

## 2015-02-08 NOTE — ED Notes (Signed)
C/o productive cough onset 1 week Denies runny nose, PND, congestion, fevers, chills Alert, no signs of acute distress.

## 2015-02-08 NOTE — ED Provider Notes (Signed)
CSN: [id number]     Arrival date & time 02/08/15  0808 History   First MD Initiated Contact with Patient 02/08/15 [phone]     Chief Complaint  Patient presents with  . Cough   (Consider location/radiation/quality/duration/timing/severity/associated sxs/prior Treatment) HPI   Cough: 2 weeks. Worse at night. Tried Nyquil, and other OTC medications w/o benefit. No change. Intermittent phlegm production. Symptoms overall are intermittent. Denies recent cold symptoms, fevers, shortness breath, palpitations, chest pain, headache, neck stiffness, abdominal pain, dysuria, frequency, back pain. No notes in the home has a cough or similar symptoms.  History reviewed. No pertinent past medical history. History reviewed. No pertinent past surgical history. Family History  Problem Relation Age of Onset  . Cancer Paternal Uncle     stomach cancer    History  Substance Use Topics  . Smoking status: Never Smoker   . Smokeless tobacco: Not on file  . Alcohol Use: No   OB History    No data available     Review of Systems Per HPI with all other pertinent systems negative.   Allergies  Review of patient's allergies indicates no known allergies.  Home Medications   Prior to Admission medications   Medication Sig Start Date End Date Taking? Authorizing Provider  amitriptyline (ELAVIL) 75 MG tablet Take 1 tablet (75 mg total) by mouth at bedtime. 10/25/14   Doris Cheadleeepak Advani, MD  benzonatate (TESSALON PERLES) 100 MG capsule Take 1-2 capsules (100-200 mg total) by mouth 3 (three) times daily as needed for cough. 02/08/15   Ozella Rocksavid J Calliope Delangel, MD  fluticasone (FLONASE) 50 MCG/ACT nasal spray Place 2 sprays into both nostrils at bedtime. 02/08/15   Ozella Rocksavid J Trinidy Masterson, MD  ipratropium (ATROVENT) 0.06 % nasal spray Place 2 sprays into both nostrils 4 (four) times daily. 02/08/15   Ozella Rocksavid J Jessic Standifer, MD  naproxen (NAPROSYN) 375 MG tablet Take 1 tablet (375 mg total) by mouth 2 (two) times daily. 12/14/14   Rodolph BongEvan S Corey,  MD  Vitamin D, Ergocalciferol, (DRISDOL) 50000 UNITS CAPS capsule Take 1 capsule (50,000 Units total) by mouth every 7 (seven) days. 10/27/14   Doris Cheadleeepak Advani, MD   BP 120/82 mmHg  Pulse 58  Temp(Src) 98 F (36.7 C) (Oral)  Resp 12  SpO2 98%  LMP 02/08/2015 Physical Exam Physical Exam  Constitutional: oriented to person, place, and time. appears well-developed and well-nourished. No distress.  HENT:  Boggy nasal turbinates on the left. Pharyngeal cobblestoning. Tonsils 0-1+ without exudate. Head: Normocephalic and atraumatic.  Eyes: EOMI. PERRL.  Neck: Normal range of motion.  Cardiovascular: RRR, no m/r/g, 2+ distal pulses,  Pulmonary/Chest: Effort normal and breath sounds normal. No respiratory distress.  Abdominal: Soft. Bowel sounds are normal. NonTTP, no distension.  Musculoskeletal: Normal range of motion. Non ttp, no effusion.  Neurological: alert and oriented to person, place, and time.  Skin: Skin is warm. No rash noted. non diaphoretic.  Psychiatric: normal mood and affect. behavior is normal. Judgment and thought content normal.   ED Course  Procedures (including critical care time) Labs Review Labs Reviewed - No data to display  Imaging Review No results found.   MDM   1. Post-nasal drip   2. Cough    Zyrtec, ibuprofen, nasal Atrovent, Flonase, Tessalon Perles. Precautions given and all questions answered.    Ozella Rocksavid J Helga Asbury, MD 02/08/15 [phone]

## 2017-10-20 ENCOUNTER — Ambulatory Visit (HOSPITAL_COMMUNITY)
Admission: EM | Admit: 2017-10-20 | Discharge: 2017-10-20 | Disposition: A | Discharge status: Home / Self Care | Payer: Self-pay | Attending: Family Medicine | Admitting: Family Medicine

## 2017-10-20 ENCOUNTER — Other Ambulatory Visit: Payer: Self-pay

## 2017-10-20 ENCOUNTER — Encounter (HOSPITAL_COMMUNITY): Payer: Self-pay | Admitting: Emergency Medicine

## 2017-10-20 DIAGNOSIS — B9789 Other viral agents as the cause of diseases classified elsewhere: Secondary | ICD-10-CM | POA: Insufficient documentation

## 2017-10-20 DIAGNOSIS — N898 Other specified noninflammatory disorders of vagina: Secondary | ICD-10-CM | POA: Insufficient documentation

## 2017-10-20 DIAGNOSIS — J029 Acute pharyngitis, unspecified: Secondary | ICD-10-CM | POA: Insufficient documentation

## 2017-10-20 LAB — POCT RAPID STREP A: STREPTOCOCCUS, GROUP A SCREEN (DIRECT): NEGATIVE

## 2017-10-20 NOTE — ED Provider Notes (Signed)
[facility]    CSN: [id number] Arrival date & time: 10/20/17  1759     History   Chief Complaint Chief Complaint  Patient presents with  . Sore Throat    HPI Beth Bernard is a 47 y.o. female presents to the urgent care facility for evaluation of sore throat pain.  Sore throat pain has been present for 3 days.  Patient denies any cough, headache, fevers.  Patient has had mild congestion and runny nose.  She is been taken Tylenol with mild improvement.  She is tolerating p.o. well.  No rashes, abdominal pain, nausea, vomiting, diarrhea.   HPI  History reviewed. No pertinent past medical history.  Patient Active Problem List   Diagnosis Date Noted  . Vaginal discharge 11/07/2014  . Prolapsed uterus 11/04/2014  . Cervical cancer screening 11/04/2014  . Cephalalgia 10/25/2014    History reviewed. No pertinent surgical history.  OB History    No data available       Home Medications    Prior to Admission medications   Medication Sig Start Date End Date Taking? Authorizing Provider  amitriptyline (ELAVIL) 75 MG tablet Take 1 tablet (75 mg total) by mouth at bedtime. 10/25/14   Doris Cheadle, MD  benzonatate (TESSALON PERLES) 100 MG capsule Take 1-2 capsules (100-200 mg total) by mouth 3 (three) times daily as needed for cough. 02/08/15   Ozella Rocks, MD  fluticasone (FLONASE) 50 MCG/ACT nasal spray Place 2 sprays into both nostrils at bedtime. 02/08/15   Ozella Rocks, MD  ipratropium (ATROVENT) 0.06 % nasal spray Place 2 sprays into both nostrils 4 (four) times daily. 02/08/15   Ozella Rocks, MD  naproxen (NAPROSYN) 375 MG tablet Take 1 tablet (375 mg total) by mouth 2 (two) times daily. 12/14/14   Rodolph Bong, MD  Vitamin D, Ergocalciferol, (DRISDOL) 50000 UNITS CAPS capsule Take 1 capsule (50,000 Units total) by mouth every 7 (seven) days. 10/27/14   Doris Cheadle, MD    Family History Family History  Problem Relation Age of Onset  .  Cancer Paternal Uncle        stomach cancer     Social History Social History   Tobacco Use  . Smoking status: Never Smoker  . Smokeless tobacco: Never Used  Substance Use Topics  . Alcohol use: No    Alcohol/week: 0.0 oz  . Drug use: No     Allergies   Patient has no known allergies.   Review of Systems Review of Systems  Constitutional: Negative for fever.  HENT: Positive for congestion, rhinorrhea and sore throat. Negative for ear discharge, sinus pressure, sinus pain, trouble swallowing and voice change.   Respiratory: Negative for cough, shortness of breath, wheezing and stridor.   Cardiovascular: Negative for chest pain.  Gastrointestinal: Negative for abdominal pain, diarrhea, nausea and vomiting.  Genitourinary: Negative for dysuria, flank pain and pelvic pain.  Musculoskeletal: Positive for myalgias. Negative for back pain.  Skin: Negative for rash.  Neurological: Negative for dizziness and headaches.     Physical Exam Triage Vital Signs ED Triage Vitals  Enc Vitals Group     BP 10/20/17 1830 127/76     Pulse Rate 10/20/17 1830 68     Resp --      Temp 10/20/17 1830 97.9 F (36.6 C)     Temp Source 10/20/17 1830 Oral     SpO2 10/20/17 1830 100 %     Weight --  Height --      Head Circumference --      Peak Flow --      Pain Score 10/20/17 1831 8     Pain Loc --      Pain Edu? --      Excl. in GC? --    No data found.  Updated Vital Signs BP 127/76 (BP Location: Left Arm)   Pulse 68   Temp 97.9 F (36.6 C) (Oral)   LMP 10/20/2017 (Exact Date)   SpO2 100%   Visual Acuity Right Eye Distance:   Left Eye Distance:   Bilateral Distance:    Right Eye Near:   Left Eye Near:    Bilateral Near:     Physical Exam  Constitutional: She is oriented to person, place, and time. She appears well-developed and well-nourished. No distress.  HENT:  Head: Normocephalic and atraumatic.  Right Ear: Hearing, tympanic membrane, external ear and ear  canal normal. No drainage.  Left Ear: Hearing, tympanic membrane, external ear and ear canal normal. No drainage.  Nose: Rhinorrhea present.  Mouth/Throat: Uvula is midline and mucous membranes are normal. No trismus in the jaw. No uvula swelling. Posterior oropharyngeal erythema present. No oropharyngeal exudate, posterior oropharyngeal edema or tonsillar abscesses. No tonsillar exudate.  Eyes: Conjunctivae are normal. Right eye exhibits no discharge. Left eye exhibits no discharge.  Neck: Normal range of motion.  Cardiovascular: Normal rate and regular rhythm.  Pulmonary/Chest: Effort normal and breath sounds normal. No stridor. No respiratory distress. She has no wheezes. She has no rales.  Abdominal: Soft. She exhibits no distension. There is no tenderness.  Musculoskeletal: Normal range of motion. She exhibits no deformity.  Lymphadenopathy:    She has cervical adenopathy.  Neurological: She is alert and oriented to person, place, and time. She has normal reflexes.  Skin: Skin is warm and dry.  Psychiatric: She has a normal mood and affect. Her behavior is normal. Thought content normal.     UC Treatments / Results  Labs (all labs ordered are listed, but only abnormal results are displayed) Labs Reviewed  CULTURE, GROUP A STREP [facility]  POCT RAPID STREP A    EKG  EKG Interpretation None       Radiology No results found.  Procedures Procedures (including critical care time)  Medications Ordered in UC Medications - No data to display   Initial Impression / Assessment and Plan / UC Course  I have reviewed the triage vital signs and the nursing notes.  Pertinent labs & imaging results that were available during my care of the patient were reviewed by me and considered in my medical decision making (see chart for details).     47 year old female with viral pharyngitis.  No signs of exudates, anterior cervical lymphadenopathy.  Rapid strep test is negative.  Encouraged  to take Tylenol and ibuprofen as needed.  She is educated on signs and symptoms to return to the ED for.  Final Clinical Impressions(s) / UC Diagnoses   Final diagnoses:  Viral pharyngitis    ED Discharge Orders    None        Evon SlackGaines, Nijah Orlich C, PA-C 10/20/17 2001

## 2017-10-20 NOTE — Discharge Instructions (Signed)
Tylenol and ibuprofen ibuprofen 800 mg every 6 hours as needed for pain.  Please alternate ibuprofen with Tylenol 1000 mg every 6 hours as needed for pain.  Please drink lots of fluids.  Return to the clinic for any difficulty swallowing, increasing pain, fevers, worsening symptoms or urgent changes in your health.  Please take ibuprofen with food.

## 2017-10-20 NOTE — ED Triage Notes (Signed)
Pt reports bilateral ear pain radiating down her neck and sore throat for 3 days.

## 2017-10-23 LAB — CULTURE, GROUP A STREP (THRC)

## 2023-01-06 ENCOUNTER — Telehealth (HOSPITAL_COMMUNITY): Payer: Self-pay

## 2023-01-06 ENCOUNTER — Encounter (HOSPITAL_COMMUNITY): Payer: Self-pay

## 2023-01-06 ENCOUNTER — Ambulatory Visit (HOSPITAL_COMMUNITY)
Admission: EM | Admit: 2023-01-06 | Discharge: 2023-01-06 | Disposition: A | Discharge status: Home / Self Care | Payer: Self-pay | Attending: Internal Medicine | Admitting: Internal Medicine

## 2023-01-06 DIAGNOSIS — L237 Allergic contact dermatitis due to plants, except food: Secondary | ICD-10-CM

## 2023-01-06 MED ORDER — HYDROXYZINE HCL 25 MG PO TABS: 25.0000 mg | ORAL_TABLET | Freq: Three times a day (TID) | ORAL | 0 refills | Status: DC | PRN

## 2023-01-06 MED ORDER — PREDNISONE 10 MG (21) PO TBPK: ORAL_TABLET | Freq: Every day | ORAL | 0 refills | Status: DC

## 2023-01-06 MED ORDER — HYDROXYZINE HCL 25 MG PO TABS: 25.0000 mg | ORAL_TABLET | Freq: Three times a day (TID) | ORAL | 0 refills | Status: AC | PRN

## 2023-01-06 NOTE — ED Triage Notes (Signed)
Patient Patient c/o left eye swelling  and rash x 8 days. Patient was cleaning the yard and poison ivy was present.  Patient is using Ivyrest and Zanfel lotion and Ibuprofen yesterday

## 2023-01-06 NOTE — Discharge Instructions (Addendum)
Please apply cool compress over the areas involved Avoid scratching the areas involved since that would make the rash spread Please take medications as prescribed If you have worsening rash, redness or fever please return to urgent care to be reevaluated.

## 2023-01-06 NOTE — ED Provider Notes (Signed)
[facility]    CSN: [id number] Arrival date & time: 01/06/23  1807      History   Chief Complaint Chief Complaint  Patient presents with   Facial Swelling   Rash    HPI Beth Bernard is a 52 y.o. female comes to the urgent care with itchy rash over the upper extremity and erythema in the left periorbital area.  Patient's symptoms started 8 days ago.  She came into contact with poison ivy while cleaning the yard.  She denies any fever or chills.  She has tried over-the-counter medications such as IV rest and Zanfel with no improvement in the symptoms.  No light sensitivity.  No eye redness.  No blurry vision or double vision.Marland Kitchen.   HPI  History reviewed. No pertinent past medical history.  Patient Active Problem List   Diagnosis Date Noted   Vaginal discharge 11/07/2014   Prolapsed uterus 11/04/2014   Cervical cancer screening 11/04/2014   Cephalalgia 10/25/2014    History reviewed. No pertinent surgical history.  OB History   No obstetric history on file.      Home Medications    Prior to Admission medications   Medication Sig Start Date End Date Taking? Authorizing Provider  hydrOXYzine (ATARAX) 25 MG tablet Take 1 tablet (25 mg total) by mouth every 8 (eight) hours as needed. 01/06/23  Yes Akira Adelsberger, Britta MccreedyPhilip O, MD  predniSONE (STERAPRED UNI-PAK 21 TAB) 10 MG (21) TBPK tablet Take by mouth daily. Take 6 tabs by mouth daily  for 2 days, then 5 tabs for 2 days, then 4 tabs for 2 days, then 3 tabs for 2 days, 2 tabs for 2 days, then 1 tab by mouth daily for 2 days 01/06/23  Yes Maddie Brazier, Britta MccreedyPhilip O, MD  amitriptyline (ELAVIL) 75 MG tablet Take 1 tablet (75 mg total) by mouth at bedtime. 10/25/14   Doris CheadleAdvani, Deepak, MD  benzonatate (TESSALON PERLES) 100 MG capsule Take 1-2 capsules (100-200 mg total) by mouth 3 (three) times daily as needed for cough. 02/08/15   Ozella RocksMerrell, David J, MD  fluticasone (FLONASE) 50 MCG/ACT nasal spray Place 2 sprays into both nostrils at  bedtime. 02/08/15   Ozella RocksMerrell, David J, MD  ipratropium (ATROVENT) 0.06 % nasal spray Place 2 sprays into both nostrils 4 (four) times daily. 02/08/15   Ozella RocksMerrell, David J, MD  naproxen (NAPROSYN) 375 MG tablet Take 1 tablet (375 mg total) by mouth 2 (two) times daily. 12/14/14   Rodolph Bongorey, Evan S, MD  Vitamin D, Ergocalciferol, (DRISDOL) 50000 UNITS CAPS capsule Take 1 capsule (50,000 Units total) by mouth every 7 (seven) days. 10/27/14   Doris CheadleAdvani, Deepak, MD    Family History Family History  Problem Relation Age of Onset   Cancer Paternal Uncle        stomach cancer     Social History Social History   Tobacco Use   Smoking status: Never   Smokeless tobacco: Never  Vaping Use   Vaping Use: Never used  Substance Use Topics   Alcohol use: No    Alcohol/week: 0.0 standard drinks of alcohol   Drug use: No     Allergies   Patient has no known allergies.   Review of Systems Review of Systems As per HPI  Physical Exam Triage Vital Signs ED Triage Vitals  Enc Vitals Group     BP 01/06/23 1823 131/76     Pulse Rate 01/06/23 1823 85     Resp 01/06/23 1823 16  Temp 01/06/23 1823 98.3 F (36.8 C)     Temp Source 01/06/23 1823 Oral     SpO2 01/06/23 1823 98 %     Weight --      Height --      Head Circumference --      Peak Flow --      Pain Score 01/06/23 1825 0     Pain Loc --      Pain Edu? --      Excl. in GC? --    No data found.  Updated Vital Signs BP 131/76 (BP Location: Left Arm)   Pulse 85   Temp 98.3 F (36.8 C) (Oral)   Resp 16   LMP 12/30/2022   SpO2 98%   Visual Acuity Right Eye Distance:   Left Eye Distance:   Bilateral Distance:    Right Eye Near:   Left Eye Near:    Bilateral Near:     Physical Exam Vitals and nursing note reviewed.  Constitutional:      General: She is not in acute distress.    Appearance: She is not ill-appearing.  HENT:     Ears:     Comments: Left periorbital swelling with erythema.  Extraocular movement intact.  No  conjunctival erythema. Cardiovascular:     Rate and Rhythm: Normal rate and regular rhythm.  Musculoskeletal:        General: Normal range of motion.  Skin:    General: Skin is warm.     Comments: Multiple linear vesicular rashes in the upper extremities.  Minimal surrounding erythema.  Neurological:     Mental Status: She is alert.      UC Treatments / Results  Labs (all labs ordered are listed, but only abnormal results are displayed) Labs Reviewed - No data to display  EKG   Radiology No results found.  Procedures Procedures (including critical care time)  Medications Ordered in UC Medications - No data to display  Initial Impression / Assessment and Plan / UC Course  I have reviewed the triage vital signs and the nursing notes.  Pertinent labs & imaging results that were available during my care of the patient were reviewed by me and considered in my medical decision making (see chart for details).     1.  Allergic contact dermatitis secondary to poison ivy: Cool compress over the affected areas Tapering dose of prednisone Hydroxyzine 25 mg 3 times daily as needed for itching Patient is advised to stop scratching Return precautions given. Final Clinical Impressions(s) / UC Diagnoses   Final diagnoses:  Allergic contact dermatitis due to [place] wood     Discharge Instructions      Please apply cool compress over the areas involved Avoid scratching the areas involved since that would make the rash spread Please take medications as prescribed If you have worsening rash, redness or fever please return to urgent care to be reevaluated.   ED Prescriptions     Medication Sig Dispense Auth. Provider   predniSONE (STERAPRED UNI-PAK 21 TAB) 10 MG (21) TBPK tablet Take by mouth daily. Take 6 tabs by mouth daily  for 2 days, then 5 tabs for 2 days, then 4 tabs for 2 days, then 3 tabs for 2 days, 2 tabs for 2 days, then 1 tab by mouth daily for 2 days 42 tablet  Dawnna Gritz, Britta Mccreedy, MD   hydrOXYzine (ATARAX) 25 MG tablet Take 1 tablet (25 mg total) by mouth every 8 (eight) hours as  needed. 24 tablet Kayci Belle[place], Britta Mccreedy, MD      PDMP not reviewed this encounter.   Merrilee Jansky, MD 01/06/23 [phone]

## 2023-11-06 ENCOUNTER — Ambulatory Visit (HOSPITAL_COMMUNITY)
Admission: EM | Admit: 2023-11-06 | Discharge: 2023-11-06 | Disposition: A | Discharge status: Home / Self Care | Payer: Self-pay | Attending: Urgent Care | Admitting: Urgent Care

## 2023-11-06 ENCOUNTER — Encounter (HOSPITAL_COMMUNITY): Payer: Self-pay

## 2023-11-06 DIAGNOSIS — Z20828 Contact with and (suspected) exposure to other viral communicable diseases: Secondary | ICD-10-CM

## 2023-11-06 DIAGNOSIS — J069 Acute upper respiratory infection, unspecified: Secondary | ICD-10-CM

## 2023-11-06 MED ORDER — OSELTAMIVIR PHOSPHATE 75 MG PO CAPS: 75.0000 mg | ORAL_CAPSULE | Freq: Every day | ORAL | 0 refills | Status: AC

## 2023-11-06 MED ORDER — BENZONATATE 100 MG PO CAPS: 100.0000 mg | ORAL_CAPSULE | Freq: Three times a day (TID) | ORAL | 0 refills | Status: DC | PRN

## 2023-11-06 NOTE — ED Triage Notes (Signed)
 Pt presents with c/o ear itching, pain X 2 days. States she also has had a mild fever and chest tightness when coughing.

## 2023-11-06 NOTE — ED Provider Notes (Signed)
 [facility]    CSN: [id number] Arrival date & time: 11/06/23  1732      History   Chief Complaint Chief Complaint  Patient presents with   Ear Pain   Fever   Cough    HPI Beth Bernard is a 53 y.o. female.   Pleasant 53 year old female presents today due to concerns of sore throat, cough, and ear pain.  She states the sore throat and ear pain started yesterday, and a dry cough started this morning.  Her primary concern however is exposure to the flu.  She states her husband is sick, and her son was diagnosed with the flu this previous weekend.  Patient denies a fever or bodyaches. She has not tried any treatment for her symptoms. She reports some sternal discomfort. No phlegm.   Cough   History reviewed. No pertinent past medical history.  Patient Active Problem List   Diagnosis Date Noted   Vaginal discharge 11/07/2014   Prolapsed uterus 11/04/2014   Cervical cancer screening 11/04/2014   Cephalalgia 10/25/2014    History reviewed. No pertinent surgical history.  OB History   No obstetric history on file.      Home Medications    Prior to Admission medications   Medication Sig Start Date End Date Taking? Authorizing Provider  benzonatate  (TESSALON  PERLES) 100 MG capsule Take 1 capsule (100 mg total) by mouth 3 (three) times daily as needed for cough. 11/06/23  Yes Cade Olberding L, PA  oseltamivir  (TAMIFLU ) 75 MG capsule Take 1 capsule (75 mg total) by mouth daily for 10 days. 11/06/23 11/16/23 Yes [place] Throckmorton L, PA  amitriptyline  (ELAVIL ) 75 MG tablet Take 1 tablet (75 mg total) by mouth at bedtime. 10/25/14   Advani, Deepak, MD  fluticasone  (FLONASE ) 50 MCG/ACT nasal spray Place 2 sprays into both nostrils at bedtime. 02/08/15   Remonia Alm PARAS, MD  hydrOXYzine  (ATARAX ) 25 MG tablet Take 1 tablet (25 mg total) by mouth every 8 (eight) hours as needed. 01/06/23   Blaise Aleene KIDD, MD  ipratropium (ATROVENT ) 0.06 % nasal spray Place 2 sprays into  both nostrils 4 (four) times daily. 02/08/15   Remonia Alm PARAS, MD  naproxen  (NAPROSYN ) 375 MG tablet Take 1 tablet (375 mg total) by mouth 2 (two) times daily. 12/14/14   Corey, Evan S, MD  predniSONE  (STERAPRED UNI-PAK 21 TAB) 10 MG (21) TBPK tablet Take by mouth daily. Take 6 tabs by mouth daily  for 2 days, then 5 tabs for 2 days, then 4 tabs for 2 days, then 3 tabs for 2 days, 2 tabs for 2 days, then 1 tab by mouth daily for 2 days 01/06/23   Blaise Aleene KIDD, MD  Vitamin D , Ergocalciferol , (DRISDOL ) 50000 UNITS CAPS capsule Take 1 capsule (50,000 Units total) by mouth every 7 (seven) days. 10/27/14   Colton Drape, MD    Family History Family History  Problem Relation Age of Onset   Cancer Paternal Uncle        stomach cancer     Social History Social History   Tobacco Use   Smoking status: Never   Smokeless tobacco: Never  Vaping Use   Vaping status: Never Used  Substance Use Topics   Alcohol use: No    Alcohol/week: 0.0 standard drinks of alcohol   Drug use: No     Allergies   Patient has no known allergies.   Review of Systems Review of Systems As per HPI  Physical Exam Triage Vital  Signs ED Triage Vitals  Encounter Vitals Group     BP 11/06/23 1806 136/73     Systolic BP Percentile --      Diastolic BP Percentile --      Pulse Rate 11/06/23 1805 76     Resp 11/06/23 1805 17     Temp 11/06/23 1805 98.5 F (36.9 C)     Temp Source 11/06/23 1805 Oral     SpO2 11/06/23 1805 97 %     Weight --      Height --      Head Circumference --      Peak Flow --      Pain Score 11/06/23 1804 3     Pain Loc --      Pain Education --      Exclude from Growth Chart --    No data found.  Updated Vital Signs BP 136/73   Pulse 76   Temp 98.5 F (36.9 C) (Oral)   Resp 17   LMP 11/06/2023   SpO2 97%   Visual Acuity Right Eye Distance:   Left Eye Distance:   Bilateral Distance:    Right Eye Near:   Left Eye Near:    Bilateral Near:     Physical  Exam Vitals and nursing note reviewed.  Constitutional:      General: She is not in acute distress.    Appearance: Normal appearance. She is normal weight. She is not ill-appearing, toxic-appearing or diaphoretic.  HENT:     Head: Normocephalic and atraumatic.     Right Ear: Tympanic membrane, ear canal and external ear normal. There is no impacted cerumen.     Left Ear: Tympanic membrane, ear canal and external ear normal. There is no impacted cerumen.     Nose: Nose normal. No congestion or rhinorrhea.     Mouth/Throat:     Mouth: Mucous membranes are moist.     Pharynx: Oropharynx is clear. No oropharyngeal exudate or posterior oropharyngeal erythema.  Eyes:     General: No scleral icterus.       Right eye: No discharge.        Left eye: No discharge.     Extraocular Movements: Extraocular movements intact.     Pupils: Pupils are equal, round, and reactive to light.  Cardiovascular:     Rate and Rhythm: Normal rate and regular rhythm.     Heart sounds: No murmur heard. Pulmonary:     Effort: Pulmonary effort is normal. No respiratory distress.     Breath sounds: Normal breath sounds. No stridor. No wheezing, rhonchi or rales.  Chest:     Chest wall: No tenderness.  Musculoskeletal:     Cervical back: Normal range of motion and neck supple. No rigidity or tenderness.  Lymphadenopathy:     Cervical: No cervical adenopathy.  Skin:    General: Skin is warm and dry.     Coloration: Skin is not jaundiced.     Findings: No bruising, erythema or rash.  Neurological:     General: No focal deficit present.     Mental Status: She is alert and oriented to person, place, and time.     Gait: Gait normal.  Psychiatric:        Mood and Affect: Mood normal.        Behavior: Behavior normal.      UC Treatments / Results  Labs (all labs ordered are listed, but only abnormal results are displayed) Labs Reviewed -  No data to display  EKG   Radiology No results  found.  Procedures Procedures (including critical care time)  Medications Ordered in UC Medications - No data to display  Initial Impression / Assessment and Plan / UC Course  I have reviewed the triage vital signs and the nursing notes.  Pertinent labs & imaging results that were available during my care of the patient were reviewed by me and considered in my medical decision making (see chart for details).     Viral URI With cough - given exposure to the flu from son and husband, will start  prophylactic dosing for tamiflu . Pts VSS, not actively exhibiting flu sx. Supportive therapy with tessalon  as needed.    Final Clinical Impressions(s) / UC Diagnoses   Final diagnoses:  Viral URI with cough  Exposure to the flu     Discharge Instructions      Please take one capsule of tamiflu  daily x 10 days. Please use 600mg  ibuprofen every 6 hours as needed for pain or fever. Use the cough medication up to every 8 hours as needed.  Return if any new or worsening symptoms.     ED Prescriptions     Medication Sig Dispense Auth. Provider   oseltamivir  (TAMIFLU ) 75 MG capsule Take 1 capsule (75 mg total) by mouth daily for 10 days. 10 capsule Rosemary Mossbarger L, PA   benzonatate  (TESSALON  PERLES) 100 MG capsule Take 1 capsule (100 mg total) by mouth 3 (three) times daily as needed for cough. 20 capsule Finesse Fielder L, PA      PDMP not reviewed this encounter.   Lowella Benton CROME, [place] 11/06/23 2205

## 2023-11-06 NOTE — Discharge Instructions (Addendum)
 Please take one capsule of tamiflu  daily x 10 days. Please use 600mg  ibuprofen every 6 hours as needed for pain or fever. Use the cough medication up to every 8 hours as needed.  Return if any new or worsening symptoms.

## 2023-12-22 ENCOUNTER — Telehealth (HOSPITAL_COMMUNITY): Payer: Self-pay

## 2023-12-22 ENCOUNTER — Encounter (HOSPITAL_COMMUNITY): Payer: Self-pay

## 2023-12-22 ENCOUNTER — Ambulatory Visit (HOSPITAL_COMMUNITY)
Admission: EM | Admit: 2023-12-22 | Discharge: 2023-12-22 | Disposition: A | Discharge status: Home / Self Care | Payer: Self-pay

## 2023-12-22 DIAGNOSIS — B349 Viral infection, unspecified: Secondary | ICD-10-CM

## 2023-12-22 MED ORDER — AZELASTINE HCL 0.1 % NA SOLN: 1.0000 | Freq: Two times a day (BID) | NASAL | 1 refills | Status: DC

## 2023-12-22 MED ORDER — BENZONATATE 200 MG PO CAPS: 200.0000 mg | ORAL_CAPSULE | Freq: Three times a day (TID) | ORAL | 0 refills | Status: DC | PRN

## 2023-12-22 MED ORDER — FEXOFENADINE HCL 60 MG PO TABS: 60.0000 mg | ORAL_TABLET | Freq: Two times a day (BID) | ORAL | 1 refills | Status: DC

## 2023-12-22 MED ORDER — FEXOFENADINE HCL 60 MG PO TABS: 60.0000 mg | ORAL_TABLET | Freq: Two times a day (BID) | ORAL | 1 refills | Status: AC

## 2023-12-22 NOTE — ED Triage Notes (Signed)
 Patient here today with c/o cough with some runny nose X 5 days. Patient states that she can't sleep due to the coughing at night. No known sick contacts.

## 2023-12-22 NOTE — ED Provider Notes (Signed)
 [facility] [place]  Note:  This document was prepared using Dragon voice recognition software and may include unintentional dictation errors.  MRN: [id number] DOB: [birth year]  Subjective:   Beth Bernard is a 53 y.o. female presenting for cough, nasal congestion, mild sore throat x 5 days.  Patient reports that cough is much worse at night, cough keeps her from falling asleep and staying asleep.  Patient denies any known sick contacts.  Patient has not been taking any over-the-counter medication to treat symptoms.  No shortness of breath, chest pain, weakness, dizziness, fever, nausea/vomiting.  No current facility-administered medications for this encounter.  Current Outpatient Medications:    azelastine (ASTELIN) 0.1 % nasal spray, Place 1 spray into both nostrils 2 (two) times daily. Use in each nostril as directed, Disp: 30 mL, Rfl: 1   benzonatate (TESSALON) 200 MG capsule, Take 1 capsule (200 mg total) by mouth 3 (three) times daily as needed for cough., Disp: 20 capsule, Rfl: 0   fexofenadine (ALLEGRA) 60 MG tablet, Take 1 tablet (60 mg total) by mouth 2 (two) times daily., Disp: 30 tablet, Rfl: 1   hydrOXYzine (ATARAX) 25 MG tablet, Take 1 tablet (25 mg total) by mouth every 8 (eight) hours as needed. (Patient not taking: Reported on 12/22/2023), Disp: 24 tablet, Rfl: 0   No Known Allergies  History reviewed. No pertinent past medical history.   History reviewed. No pertinent surgical history.  Family History  Problem Relation Age of Onset   Cancer Paternal Uncle        stomach cancer     Social History   Tobacco Use   Smoking status: Never   Smokeless tobacco: Never  Vaping Use   Vaping status: Never Used  Substance Use Topics   Alcohol use: No    Alcohol/week: 0.0 standard drinks of alcohol   Drug use: No    ROS Refer to HPI for ROS details.  Objective:   Vitals: BP 137/78 (BP Location: Left Arm)   Pulse 72   Temp 98.3 F (36.8 C) (Oral)    Resp 16   LMP 10/24/2023 (Approximate)   SpO2 97%   Physical Exam Constitutional:      General: She is not in acute distress.    Appearance: Normal appearance. She is not ill-appearing.  HENT:     Nose: Congestion and rhinorrhea present.     Mouth/Throat:     Mouth: Mucous membranes are moist.     Pharynx: Oropharynx is clear. No oropharyngeal exudate or posterior oropharyngeal erythema.  Eyes:     Extraocular Movements: Extraocular movements intact.     Conjunctiva/sclera: Conjunctivae normal.  Cardiovascular:     Rate and Rhythm: Normal rate and regular rhythm.     Heart sounds: No murmur heard. Pulmonary:     Effort: Pulmonary effort is normal. No respiratory distress.     Breath sounds: Normal breath sounds. No stridor. No wheezing, rhonchi or rales.  Skin:    General: Skin is warm and dry.  Neurological:     General: No focal deficit present.     Mental Status: She is alert and oriented to person, place, and time.  Psychiatric:        Mood and Affect: Mood normal.        Behavior: Behavior normal.     Procedures  No results found for this or any previous visit (from the past 24 hours).  Assessment and Plan :   PDMP not reviewed this encounter.  1. Acute  viral syndrome    1. Acute viral syndrome (Primary) - fexofenadine (ALLEGRA) 60 MG tablet; Take 1 tablet (60 mg total) by mouth 2 (two) times daily.  Dispense: 30 tablet; Refill: 1 - azelastine (ASTELIN) 0.1 % nasal spray; Place 1 spray into both nostrils 2 (two) times daily. Use in each nostril as directed  Dispense: 30 mL; Refill: 1 - benzonatate (TESSALON) 200 MG capsule; Take 1 capsule (200 mg total) by mouth 3 (three) times daily as needed for cough.  Dispense: 20 capsule; Refill: 0 -Take medications as directed and continue to monitor symptoms for any change in severity there is any escalation of current symptoms or development of new symptoms follow-up for further evaluation and management.  Lucky Cowboy   [place], [place], [place] 12/22/23 [phone]

## 2023-12-22 NOTE — Discharge Instructions (Addendum)
 1. Acute viral syndrome (Primary) - fexofenadine (ALLEGRA) 60 MG tablet; Take 1 tablet (60 mg total) by mouth 2 (two) times daily.  Dispense: 30 tablet; Refill: 1 - azelastine (ASTELIN) 0.1 % nasal spray; Place 1 spray into both nostrils 2 (two) times daily. Use in each nostril as directed  Dispense: 30 mL; Refill: 1 - benzonatate (TESSALON) 200 MG capsule; Take 1 capsule (200 mg total) by mouth 3 (three) times daily as needed for cough.  Dispense: 20 capsule; Refill: 0 -Take medications as directed and continue to monitor symptoms for any change in severity there is any escalation of current symptoms or development of new symptoms follow-up for further evaluation and management.

## 2023-12-23 ENCOUNTER — Telehealth (HOSPITAL_COMMUNITY): Payer: Self-pay

## 2023-12-23 DIAGNOSIS — B349 Viral infection, unspecified: Secondary | ICD-10-CM

## 2023-12-23 MED ORDER — AZELASTINE HCL 0.1 % NA SOLN: 1.0000 | Freq: Two times a day (BID) | NASAL | 1 refills | Status: AC

## 2023-12-23 NOTE — Telephone Encounter (Signed)
 Prescription for azelastine nasal spray resent to preferred pharmacy.  Original order was discontinued before being filled by pharmacy.

## 2024-10-07 ENCOUNTER — Ambulatory Visit (HOSPITAL_COMMUNITY)

## 2024-10-07 ENCOUNTER — Ambulatory Visit (HOSPITAL_COMMUNITY)
Admission: EM | Admit: 2024-10-07 | Discharge: 2024-10-07 | Disposition: A | Discharge status: Home / Self Care | Attending: Emergency Medicine | Admitting: Emergency Medicine

## 2024-10-07 ENCOUNTER — Encounter (HOSPITAL_COMMUNITY): Payer: Self-pay

## 2024-10-07 DIAGNOSIS — J029 Acute pharyngitis, unspecified: Secondary | ICD-10-CM | POA: Diagnosis not present

## 2024-10-07 DIAGNOSIS — J40 Bronchitis, not specified as acute or chronic: Secondary | ICD-10-CM | POA: Diagnosis not present

## 2024-10-07 DIAGNOSIS — R059 Cough, unspecified: Secondary | ICD-10-CM | POA: Diagnosis not present

## 2024-10-07 MED ORDER — DOXYCYCLINE HYCLATE 100 MG PO TABS: 100.0000 mg | ORAL_TABLET | Freq: Two times a day (BID) | ORAL | 0 refills | Status: AC

## 2024-10-07 MED ORDER — BENZONATATE 100 MG PO CAPS: 100.0000 mg | ORAL_CAPSULE | Freq: Three times a day (TID) | ORAL | 0 refills | Status: AC

## 2024-10-07 MED ORDER — PREDNISONE 20 MG PO TABS: 40.0000 mg | ORAL_TABLET | Freq: Every day | ORAL | 0 refills | Status: AC

## 2024-10-07 NOTE — ED Triage Notes (Signed)
 Pt c/o cough, fever, body ache, headache, and sore throat x2wks. States taken advil with some relief.

## 2024-10-07 NOTE — Discharge Instructions (Signed)
 Take the antibiotics twice daily with food  Prednisone  in morning with breakfast  Stay hydrated and get plenty rest  Tessalon  as needed for cough  Tylenol and ibuprofen as needed for fever or body pains  Return to clinic for new concerning symptoms or if no improvement over the weekend

## 2024-10-07 NOTE — ED Provider Notes (Signed)
 " [facility]    CSN: [id number] Arrival date & time: 10/07/24  1835      History   Chief Complaint Chief Complaint  Patient presents with   Cough    HPI Beth Bernard is a 54 y.o. female.   Patient presents to clinic with her husband, who provides Spanish interpretation.  Patient declined language interpreter.  Patient reports cough, fever, body aches, headache and sore throat x2 weeks  Felt hot last night, subjective fever  Has had some wheezing or shortness of breath at night  Tried Advil and this helps w/ body aches but then symptoms return  Husband has been sick with similar symptoms   Denies emesis or diarrhea   The history is provided by the patient, medical records and the spouse. The history is limited by a language barrier. A language interpreter was used.  Cough   History reviewed. No pertinent past medical history.  Patient Active Problem List   Diagnosis Date Noted   Vaginal discharge 11/07/2014   Prolapsed uterus 11/04/2014   Cervical cancer screening 11/04/2014   Cephalalgia 10/25/2014    History reviewed. No pertinent surgical history.  OB History   No obstetric history on file.      Home Medications    Prior to Admission medications  Medication Sig Start Date End Date Taking? Authorizing Provider  benzonatate  (TESSALON ) 100 MG capsule Take 1 capsule (100 mg total) by mouth every 8 (eight) hours. 10/07/24  Yes Eliot Bencivenga  N, FNP  doxycycline  (VIBRA -TABS) 100 MG tablet Take 1 tablet (100 mg total) by mouth 2 (two) times daily for 7 days. 10/07/24 10/14/24 Yes Ashmi Blas  N, FNP  predniSONE  (DELTASONE ) 20 MG tablet Take 2 tablets (40 mg total) by mouth daily for 5 days. 10/07/24 10/12/24 Yes Benno Brensinger  N, FNP  azelastine  (ASTELIN ) 0.1 % nasal spray Place 1 spray into both nostrils 2 (two) times daily. Use in each nostril as directed 12/23/23   Reddick, Johnathan B, NP  fexofenadine  (ALLEGRA ) 60 MG tablet Take 1 tablet  (60 mg total) by mouth 2 (two) times daily. 12/22/23   Reddick, Johnathan B, NP  hydrOXYzine  (ATARAX ) 25 MG tablet Take 1 tablet (25 mg total) by mouth every 8 (eight) hours as needed. Patient not taking: Reported on 12/22/2023 01/06/23   Blaise Aleene KIDD, MD    Family History Family History  Problem Relation Age of Onset   Cancer Paternal Uncle        stomach cancer     Social History Social History[1]   Allergies   Patient has no known allergies.   Review of Systems Review of Systems  Per HPI  Physical Exam Triage Vital Signs ED Triage Vitals  Encounter Vitals Group     BP 10/07/24 1934 (!) 151/69     Girls Systolic BP Percentile --      Girls Diastolic BP Percentile --      Boys Systolic BP Percentile --      Boys Diastolic BP Percentile --      Pulse Rate 10/07/24 1934 86     Resp 10/07/24 1934 18     Temp 10/07/24 1934 98.1 F (36.7 C)     Temp Source 10/07/24 1934 Oral     SpO2 10/07/24 1934 98 %     Weight --      Height --      Head Circumference --      Peak Flow --      Pain Score  10/07/24 1932 10     Pain Loc --      Pain Education --      Exclude from Growth Chart --    No data found.  Updated Vital Signs BP (!) 151/69 (BP Location: Left Arm)   Pulse 86   Temp 98.1 F (36.7 C) (Oral)   Resp 18   SpO2 98%   Visual Acuity Right Eye Distance:   Left Eye Distance:   Bilateral Distance:    Right Eye Near:   Left Eye Near:    Bilateral Near:     Physical Exam Vitals and nursing note reviewed.  Constitutional:      Appearance: Normal appearance.  HENT:     Head: Normocephalic and atraumatic.     Right Ear: External ear normal.     Left Ear: External ear normal.     Nose: Congestion present.     Mouth/Throat:     Mouth: Mucous membranes are moist.     Pharynx: Posterior oropharyngeal erythema present.  Eyes:     Conjunctiva/sclera: Conjunctivae normal.  Cardiovascular:     Rate and Rhythm: Normal rate and regular rhythm.     Heart  sounds: Normal heart sounds. No murmur heard. Pulmonary:     Effort: Pulmonary effort is normal. No respiratory distress.     Breath sounds: Normal breath sounds.  Skin:    General: Skin is warm and dry.  Neurological:     General: No focal deficit present.     Mental Status: She is alert.  Psychiatric:        Mood and Affect: Mood normal.      UC Treatments / Results  Labs (all labs ordered are listed, but only abnormal results are displayed) Labs Reviewed - No data to display  EKG   Radiology DG Chest 2 View Result Date: 10/07/2024 EXAM: 2 VIEW(S) XRAY OF THE CHEST 10/07/2024 07:56:13 PM COMPARISON: 08/20/2005 CLINICAL HISTORY: cough x2 weeks FINDINGS: LUNGS AND PLEURA: No focal pulmonary opacity. No pleural effusion. No pneumothorax. HEART AND MEDIASTINUM: No acute abnormality of the cardiac and mediastinal silhouettes. BONES AND SOFT TISSUES: No acute osseous abnormality. IMPRESSION: 1. No acute cardiopulmonary process. Electronically signed by: Franky Crease MD MD 10/07/2024 07:59 PM EST RP Workstation: [id number]    Procedures Procedures (including critical care time)  Medications Ordered in UC Medications - No data to display  Initial Impression / Assessment and Plan / UC Course  I have reviewed the triage vital signs and the nursing notes.  Pertinent labs & imaging results that were available during my care of the patient were reviewed by me and considered in my medical decision making (see chart for details).  Vitals and triage reviewed, patient is hemodynamically stable.  Lungs vesicular, heart with regular rate and rhythm.  Congestion and posterior pharynx erythema present.  Chest x-ray by my interpretation does not show acute abnormality, confirmed with radiology overread.  Will treat for acute bronchitis with prednisone .  Bacterial etiology covered with doxycycline  for acute sinusitis versus respiratory etiology.  Symptomatic management for cough  discussed.  Plan of care, follow-up care and return precautions given, no questions at this time.    Final Clinical Impressions(s) / UC Diagnoses   Final diagnoses:  Cough, unspecified type  Bronchitis  Acute pharyngitis, unspecified etiology     Discharge Instructions      Take the antibiotics twice daily with food  Prednisone  in morning with breakfast  Stay hydrated and get plenty rest  Tessalon  as needed for cough  Tylenol and ibuprofen as needed for fever or body pains  Return to clinic for new concerning symptoms or if no improvement over the weekend       ED Prescriptions     Medication Sig Dispense Auth. Provider   predniSONE  (DELTASONE ) 20 MG tablet Take 2 tablets (40 mg total) by mouth daily for 5 days. 10 tablet Dreama, Isabellah Sobocinski  N, FNP   doxycycline  (VIBRA -TABS) 100 MG tablet Take 1 tablet (100 mg total) by mouth 2 (two) times daily for 7 days. 14 tablet Dreama, Kalla Watson  N, FNP   benzonatate  (TESSALON ) 100 MG capsule Take 1 capsule (100 mg total) by mouth every 8 (eight) hours. 21 capsule Dreama, Kallon Caylor  N, FNP      PDMP not reviewed this encounter.     [1]  Social History Tobacco Use   Smoking status: Never   Smokeless tobacco: Never  Vaping Use   Vaping status: Never Used  Substance Use Topics   Alcohol use: No    Alcohol/week: 0.0 standard drinks of alcohol   Drug use: No     Dreama Lind SAILOR, FNP 10/07/24 2019  "
